# Patient Record
Sex: Female | Born: 1970 | Race: White | Hispanic: No | Marital: Single | Smoking: Former smoker
Health system: Southern US, Community
[De-identification: ages and names within clinical notes are randomized; demographics above are authoritative.]

## PROBLEM LIST (undated history)

## (undated) DIAGNOSIS — E119 Type 2 diabetes mellitus without complications: Secondary | ICD-10-CM

## (undated) DIAGNOSIS — J45909 Unspecified asthma, uncomplicated: Secondary | ICD-10-CM

## (undated) DIAGNOSIS — E7800 Pure hypercholesterolemia, unspecified: Secondary | ICD-10-CM

## (undated) DIAGNOSIS — E079 Disorder of thyroid, unspecified: Secondary | ICD-10-CM

---

## 2008-06-14 HISTORY — PX: PILONIDAL CYST DRAINAGE: SHX743

## 2021-03-26 ENCOUNTER — Other Ambulatory Visit: Payer: Self-pay

## 2021-03-26 ENCOUNTER — Ambulatory Visit: Payer: Self-pay

## 2021-03-26 ENCOUNTER — Ambulatory Visit
Admission: EM | Admit: 2021-03-26 | Discharge: 2021-03-26 | Disposition: A | Discharge status: Home / Self Care | Payer: 59 | Attending: Family Medicine | Admitting: Family Medicine

## 2021-03-26 DIAGNOSIS — J0100 Acute maxillary sinusitis, unspecified: Secondary | ICD-10-CM

## 2021-03-26 DIAGNOSIS — R102 Pelvic and perineal pain unspecified side: Secondary | ICD-10-CM

## 2021-03-26 DIAGNOSIS — E282 Polycystic ovarian syndrome: Secondary | ICD-10-CM | POA: Diagnosis present

## 2021-03-26 DIAGNOSIS — E1165 Type 2 diabetes mellitus with hyperglycemia: Secondary | ICD-10-CM | POA: Diagnosis present

## 2021-03-26 HISTORY — DX: Disorder of thyroid, unspecified: E07.9

## 2021-03-26 HISTORY — DX: Type 2 diabetes mellitus without complications: E11.9

## 2021-03-26 HISTORY — DX: Unspecified asthma, uncomplicated: J45.909

## 2021-03-26 HISTORY — DX: Pure hypercholesterolemia, unspecified: E78.00

## 2021-03-26 LAB — POCT URINALYSIS DIP (MANUAL ENTRY)
Bilirubin, UA: NEGATIVE
Glucose, UA: >=1000 mg/dL — AB
Leukocytes, UA: NEGATIVE
Nitrite, UA: NEGATIVE
Protein Ur, POC: NEGATIVE mg/dL
Spec Grav, UA: 1.01 (ref 1.010–1.025)
Urobilinogen, UA: 0.2 E.U./dL
pH, UA: 5 (ref 5.0–8.0)

## 2021-03-26 LAB — POCT FASTING CBG KUC MANUAL ENTRY: POCT Glucose (KUC): 383 mg/dL — AB (ref 70–99)

## 2021-03-26 MED ORDER — EMPAGLIFLOZIN 25 MG PO TABS: 25.0000 mg | ORAL_TABLET | Freq: Every day | ORAL | 2 refills | Status: AC

## 2021-03-26 MED ORDER — AMOXICILLIN-POT CLAVULANATE 875-125 MG PO TABS: 1.0000 | ORAL_TABLET | Freq: Two times a day (BID) | ORAL | 0 refills | Status: DC

## 2021-03-26 MED ORDER — NORETHIN-ETH ESTRAD TRIPHASIC 0.5/0.75/1-35 MG-MCG PO TABS: 1.0000 | ORAL_TABLET | Freq: Every day | ORAL | 2 refills | Status: DC

## 2021-03-26 NOTE — ED Triage Notes (Signed)
Pt presents with complaints of cough, chest congestion, and scratchy throat.  Pt endorses in April she had an elevated WBC she was supposed to get rechecked. In May she was diagnosed with a pelvic infection that got treated with doxycycline. She then got Covid in June. She never had her WBC rechecked. Reports still having pelvic pressure.

## 2021-03-27 LAB — CERVICOVAGINAL ANCILLARY ONLY
Bacterial Vaginitis (gardnerella): NEGATIVE
Candida Glabrata: POSITIVE — AB
Candida Vaginitis: NEGATIVE
Chlamydia: NEGATIVE
Comment: NEGATIVE
Comment: NEGATIVE
Comment: NEGATIVE
Comment: NEGATIVE
Comment: NEGATIVE
Comment: NORMAL
Neisseria Gonorrhea: NEGATIVE
Trichomonas: NEGATIVE

## 2021-03-30 ENCOUNTER — Telehealth (HOSPITAL_COMMUNITY): Payer: Self-pay | Admitting: Emergency Medicine

## 2021-03-30 MED ORDER — TERCONAZOLE 0.4 % VA CREA
1.0000 | TOPICAL_CREAM | Freq: Every day | VAGINAL | 0 refills | Status: AC
Start: 2021-03-30 — End: 2021-04-04

## 2021-03-30 NOTE — ED Provider Notes (Signed)
[facility]    CSN: [id number] Arrival date & time: 03/26/21  1609      History   Chief Complaint Chief Complaint  Patient presents with   Nasal Congestion   Cough    HPI Christy Torres is a 50 y.o. female.   Presenting today with multiple concerns. She states she is having several week history of cough productive of sputum, scratchy throat, worsening sinus pain and pressure, and fatigue. Has had these issues ongoing since getting COVID earlier this year which she is suffering from long covid sxs including chronic cough, wheezing, severe fatigue where she is often unable to leave her bed. She has been trying OTC cold medication with minimal relief. Known hx of asthma, allergic rhinitis on intermittent antihistamine, singulair regimen. No known new sick contacts. She is also complaining of ongoing pelvic pressure and pain for the past 5 months or so. She states she was treated for a pelvic infection with doxycycline [place] her but sxs never improved. Some discharge but no dysuria, hematuria, urinary frequency, new sexual partners, concern for STI or pregnancy. LMP 03/04/21. 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 Has yet to be able to establish with a PCP since moving her several months ago. Denies fever, chills, CP, SOB, N/V/D.    Past Medical History:  Diagnosis Date   Asthma    Diabetes mellitus without complication (HCC)    Hypercholesteremia    Thyroid disease     There are no problems to display for this patient.   History reviewed. No pertinent surgical history.  OB History   No obstetric history on file.      Home Medications    Prior to Admission medications   Medication Sig Start Date End Date Taking? Authorizing Provider  amoxicillin-clavulanate (AUGMENTIN) 875-125 MG tablet Take 1 tablet by mouth every 12 (twelve) hours. 03/26/21  Yes  Particia Nearing, PA-C  azelastine (OPTIVAR) 0.05 % ophthalmic solution 1 drop 2 (two) times daily.   Yes [provider]  escitalopram (LEXAPRO) 10 MG tablet Take 10 mg by mouth daily.   Yes [provider]  glimepiride (AMARYL) 4 MG tablet Take 4 mg by mouth daily with breakfast.   Yes [provider]  levothyroxine (SYNTHROID) 50 MCG tablet Take 50 mcg by mouth daily before breakfast.   Yes [provider]  metoprolol succinate (TOPROL-XL) 25 MG 24 hr tablet Take 25 mg by mouth daily.   Yes [provider]  montelukast (SINGULAIR) 10 MG tablet Take 10 mg by mouth at bedtime.   Yes [provider]  simvastatin (ZOCOR) 10 MG tablet Take 10 mg by mouth daily.   Yes [provider]  sitaGLIPtin-metformin (JANUMET) 50-1000 MG tablet Take 1 tablet by mouth 2 (two) times daily with a meal.   Yes [provider]  empagliflozin (JARDIANCE) 25 MG TABS tablet Take 1 tablet (25 mg total) by mouth daily. 03/26/21   Particia Nearing, PA-C  norethindrone-ethinyl estradiol (CYCLAFEM) 0.5/0.75/1-35 MG-MCG tablet Take 1 tablet by mouth daily. 03/26/21   Particia Nearing, PA-C  terconazole (TERAZOL 7) 0.4 % vaginal cream Place 1 applicator vaginally at bedtime for 5 days. 03/30/21 04/04/21  Merrilee Jansky, MD    Family History Family History  Problem Relation Age of Onset   Healthy Mother    Healthy Father  Social History Social History   Tobacco Use   Smoking status: Never   Smokeless tobacco: Never     Allergies   Patient has no known allergies.   Review of Systems Review of Systems PER HPI   Physical Exam Triage Vital Signs ED Triage Vitals  Enc Vitals Group     BP 03/26/21 1713 (!) 144/97     Pulse Rate 03/26/21 1713 (!) 107     Resp 03/26/21 1713 19     Temp 03/26/21 1713 98.9 F (37.2 C)     Temp src --      SpO2 03/26/21 1713 97 %     Weight --      Height --      Head  Circumference --      Peak Flow --      Pain Score 03/26/21 1708 2     Pain Loc --      Pain Edu? --      Excl. in GC? --    No data found.  Updated Vital Signs BP (!) 144/97   Pulse (!) 107   Temp 98.9 F (37.2 C)   Resp 19   LMP 03/04/2021   SpO2 97%   Visual Acuity Right Eye Distance:   Left Eye Distance:   Bilateral Distance:    Right Eye Near:   Left Eye Near:    Bilateral Near:     Physical Exam Vitals and nursing note reviewed.  Constitutional:      Appearance: Normal appearance. She is not ill-appearing.  HENT:     Head: Atraumatic.     Right Ear: Tympanic membrane normal.     Left Ear: Tympanic membrane normal.     Nose: Congestion present.     Comments: B/l maxillary sinuses ttp    Mouth/Throat:     Mouth: Mucous membranes are moist.  Eyes:     Extraocular Movements: Extraocular movements intact.     Conjunctiva/sclera: Conjunctivae normal.  Cardiovascular:     Rate and Rhythm: Normal rate and regular rhythm.     Heart sounds: Normal heart sounds.  Pulmonary:     Effort: Pulmonary effort is normal. No respiratory distress.     Breath sounds: Normal breath sounds. No wheezing or rales.  Abdominal:     General: Bowel sounds are normal. There is no distension.     Palpations: Abdomen is soft.     Tenderness: There is no abdominal tenderness. There is no guarding.  Musculoskeletal:        General: Normal range of motion.     Cervical back: Normal range of motion and neck supple.     Comments: Denies GU exam, self swab performed   Skin:    General: Skin is warm and dry.  Neurological:     Mental Status: She is alert and oriented to person, place, and time.     Motor: No weakness.     Gait: Gait normal.  Psychiatric:        Mood and Affect: Mood normal.        Thought Content: Thought content normal.        Judgment: Judgment normal.     UC Treatments / Results  Labs (all labs ordered are listed, but only abnormal results are displayed) Labs  Reviewed  POCT URINALYSIS DIP (MANUAL ENTRY) - Abnormal; Notable for the following components:      Result Value   Glucose, UA >=1,000 (*)    Ketones, POC  UA trace (5) (*)    Blood, UA trace-intact (*)    All other components within normal limits  POCT FASTING CBG [facility] MANUAL ENTRY - Abnormal; Notable for the following components:   POCT Glucose ([facility]) 383 (*)    All other components within normal limits  CERVICOVAGINAL ANCILLARY ONLY - Abnormal; Notable for the following components:   Candida Glabrata Positive (*)    All other components within normal limits    EKG   Radiology No results found.  Procedures Procedures (including critical care time)  Medications Ordered in UC Medications - No data to display  Initial Impression / Assessment and Plan / UC Course  I have reviewed the triage vital signs and the nursing notes.  Pertinent labs & imaging results that were available during my care of the patient were reviewed by me and considered in my medical decision making (see chart for details).     Will cover for sinusitis with augmentin though suspect her current sxs more inflammatory, allergic in nature. Unable to treat with steroids as her glucose is signifciantly poorly controlled in the high 300s currently and she also has intolerance to steroid nasal sprays. COntinue allergy regimen as tolerated. WIll refill jardiance and reviewed lifestyle measures for improved control. She establishes next month with PCP for recheck. Also requests OCP refill for her PCOS. Vaginal swab sent given ongoing pelvic pressure. U/A neg for UTI.   Final Clinical Impressions(s) / UC Diagnoses   Final diagnoses:  Acute maxillary sinusitis, recurrence not specified  Type 2 diabetes mellitus with hyperglycemia, without long-term current use of insulin (HCC)  PCOS (polycystic ovarian syndrome)  Pelvic pressure in female   Discharge Instructions   None    ED Prescriptions     Medication Sig  Dispense Auth. Provider   empagliflozin (JARDIANCE) 25 MG TABS tablet Take 1 tablet (25 mg total) by mouth daily. 30 tablet Particia Nearing, [place]   norethindrone-ethinyl estradiol (CYCLAFEM) 0.5/0.75/1-35 MG-MCG tablet Take 1 tablet by mouth daily. 28 tablet Particia Nearing, [place]   amoxicillin-clavulanate (AUGMENTIN) 875-125 MG tablet Take 1 tablet by mouth every 12 (twelve) hours. 14 tablet Particia Nearing, [place]      PDMP not reviewed this encounter.   Particia Nearing, [place] 03/30/21 2201

## 2021-04-09 ENCOUNTER — Other Ambulatory Visit: Payer: Self-pay

## 2021-04-09 ENCOUNTER — Ambulatory Visit
Admission: EM | Admit: 2021-04-09 | Discharge: 2021-04-09 | Disposition: A | Discharge status: Home / Self Care | Payer: 59

## 2021-04-09 ENCOUNTER — Ambulatory Visit (INDEPENDENT_AMBULATORY_CARE_PROVIDER_SITE_OTHER): Payer: 59

## 2021-04-09 ENCOUNTER — Encounter: Payer: Self-pay | Admitting: Emergency Medicine

## 2021-04-09 DIAGNOSIS — R0602 Shortness of breath: Secondary | ICD-10-CM | POA: Diagnosis not present

## 2021-04-09 DIAGNOSIS — R059 Cough, unspecified: Secondary | ICD-10-CM

## 2021-04-09 DIAGNOSIS — R051 Acute cough: Secondary | ICD-10-CM | POA: Diagnosis not present

## 2021-04-09 MED ORDER — PREDNISONE 50 MG PO TABS: ORAL_TABLET | ORAL | 0 refills | Status: DC

## 2021-04-09 NOTE — ED Triage Notes (Signed)
Patient c/o productive cough and sinus pressure x 2 weeks.   Patient endorses increasing fatigue. Patient endorses difficulty with inhalation at times.   Patient endorses SOB upon exertion at times.   Patient endorses chest congestion.   Patient was seen at this clinic on 10/13 with similar symptoms that haven't resolved.   Patient endorses "since I had COVID in June, I've had fatigue and I haven't exactly been the same".   History of Asthma.

## 2021-04-09 NOTE — ED Provider Notes (Signed)
[facility]    CSN: [id number] Arrival date & time: 04/09/21  1607      History   Chief Complaint Chief Complaint  Patient presents with   Cough    HPI Christy Torres is a 50 y.o. female.   The history is provided by the patient. No language interpreter was used.  Cough Cough characteristics:  Non-productive Sputum characteristics:  Nondescript Severity:  Moderate Onset quality:  Sudden Timing:  Constant Progression:  Worsening Chronicity:  New Smoker: no   Relieved by:  Nothing Worsened by:  Nothing Ineffective treatments:  None tried Associated symptoms: rhinorrhea, shortness of breath and sinus congestion   Risk factors: recent infection   Pt reports she has had cough for several weeks.  Pt has had asthma. Pt treated 2 weeks ago with antibiotic.  Pt has continued to cough  Past Medical History:  Diagnosis Date   Asthma    Diabetes mellitus without complication (HCC)    Hypercholesteremia    Thyroid disease     There are no problems to display for this patient.   History reviewed. No pertinent surgical history.  OB History   No obstetric history on file.      Home Medications    Prior to Admission medications   Medication Sig Start Date End Date Taking? Authorizing Provider  cetirizine (ZYRTEC) 10 MG tablet Take 10 mg by mouth daily.   Yes [provider]  empagliflozin (JARDIANCE) 25 MG TABS tablet Take 1 tablet (25 mg total) by mouth daily. 03/26/21  Yes Particia Nearing, PA-C  escitalopram (LEXAPRO) 10 MG tablet Take 10 mg by mouth daily.   Yes [provider]  glimepiride (AMARYL) 4 MG tablet Take 4 mg by mouth daily with breakfast.   Yes [provider]  levothyroxine (SYNTHROID) 50 MCG tablet Take 50 mcg by mouth daily before breakfast.   Yes [provider]  metoprolol succinate (TOPROL-XL) 25 MG 24 hr tablet Take 25 mg by mouth daily.   Yes [provider]  montelukast  (SINGULAIR) 10 MG tablet Take 10 mg by mouth at bedtime.   Yes [provider]  norethindrone-ethinyl estradiol (CYCLAFEM) 0.5/0.75/1-35 MG-MCG tablet Take 1 tablet by mouth daily. 03/26/21  Yes Particia Nearing, PA-C  simvastatin (ZOCOR) 10 MG tablet Take 10 mg by mouth daily.   Yes [provider]  sitaGLIPtin-metformin (JANUMET) 50-1000 MG tablet Take 1 tablet by mouth 2 (two) times daily with a meal.   Yes [provider]  amoxicillin-clavulanate (AUGMENTIN) 875-125 MG tablet Take 1 tablet by mouth every 12 (twelve) hours. 03/26/21   Particia Nearing, PA-C  azelastine (OPTIVAR) 0.05 % ophthalmic solution 1 drop 2 (two) times daily.    [provider]    Family History Family History  Problem Relation Age of Onset   Healthy Mother    Healthy Father     Social History Social History   Tobacco Use   Smoking status: Never   Smokeless tobacco: Never     Allergies   Patient has no known allergies.   Review of Systems Review of Systems  HENT:  Positive for rhinorrhea.   Respiratory:  Positive for cough and shortness of breath.   All other systems reviewed and are negative.   Physical Exam Triage Vital Signs ED Triage Vitals [04/09/21 1059]  Enc Vitals Group     BP (!) 164/99     Pulse Rate 91     Resp 18  Temp 98.2 F (36.8 C)     Temp Source Oral     SpO2 96 %     Weight      Height      Head Circumference      Peak Flow      Pain Score 7     Pain Loc      Pain Edu?      Excl. in GC?    No data found.  Updated Vital Signs BP (!) 164/99 (BP Location: Right Arm)   Pulse 91   Temp 98.2 F (36.8 C) (Oral)   Resp 18   LMP 04/01/2021 (Exact Date)   SpO2 96%   Visual Acuity Right Eye Distance:   Left Eye Distance:   Bilateral Distance:    Right Eye Near:   Left Eye Near:    Bilateral Near:     Physical Exam Vitals and nursing note reviewed.  Constitutional:      Appearance: She is well-developed.   HENT:     Head: Normocephalic.     Mouth/Throat:     Mouth: Mucous membranes are moist.  Cardiovascular:     Rate and Rhythm: Normal rate.  Pulmonary:     Effort: Pulmonary effort is normal.  Abdominal:     General: Abdomen is flat. There is no distension.  Musculoskeletal:        General: Normal range of motion.     Cervical back: Normal range of motion.  Skin:    General: Skin is warm.  Neurological:     General: No focal deficit present.     Mental Status: She is alert and oriented to person, place, and time.     UC Treatments / Results  Labs (all labs ordered are listed, but only abnormal results are displayed) Labs Reviewed - No data to display  EKG   Radiology DG Chest 2 View  Result Date: 04/09/2021 CLINICAL DATA:  50 year old female with history of cough. Difficult inhalation. Shortness of breath on exertion. EXAM: CHEST - 2 VIEW COMPARISON:  No priors. FINDINGS: Lung volumes are normal. No consolidative airspace disease. No pleural effusions. No pneumothorax. No pulmonary nodule or mass noted. Pulmonary vasculature and the cardiomediastinal silhouette are within normal limits. IMPRESSION: No radiographic evidence of acute cardiopulmonary disease. Electronically Signed   By: Trudie Reed M.D.   On: 04/09/2021 11:11    Procedures Procedures (including critical care time)  Medications Ordered in UC Medications - No data to display  Initial Impression / Assessment and Plan / UC Course  I have reviewed the triage vital signs and the nursing notes.  Pertinent labs & imaging results that were available during my care of the patient were reviewed by me and considered in my medical decision making (see chart for details).     MDM:  chest xray  no acute   Pt given rx for prednisone.  Pt advised to return if any problems.  Final Clinical Impressions(s) / UC Diagnoses   Final diagnoses:  Acute cough   Discharge Instructions   None    ED Prescriptions    None    PDMP not reviewed this encounter. An After Visit Summary was printed and given to the patient.    Elson Areas, [place] 04/09/21 1128

## 2021-04-09 NOTE — Discharge Instructions (Signed)
Return if any problems.

## 2021-12-21 ENCOUNTER — Other Ambulatory Visit (HOSPITAL_COMMUNITY)
Admission: RE | Admit: 2021-12-21 | Discharge: 2021-12-21 | Disposition: A | Discharge status: Home / Self Care | Payer: 59 | Source: Ambulatory Visit | Attending: Registered Nurse | Admitting: Registered Nurse

## 2021-12-21 ENCOUNTER — Encounter: Payer: Self-pay | Admitting: Registered Nurse

## 2021-12-21 ENCOUNTER — Ambulatory Visit (INDEPENDENT_AMBULATORY_CARE_PROVIDER_SITE_OTHER): Payer: 59 | Admitting: Registered Nurse

## 2021-12-21 VITALS — BP 118/82 | HR 86 | Temp 98.5°F | Resp 16 | Ht 68.0 in | Wt 256.0 lb

## 2021-12-21 DIAGNOSIS — E1165 Type 2 diabetes mellitus with hyperglycemia: Secondary | ICD-10-CM | POA: Diagnosis not present

## 2021-12-21 DIAGNOSIS — Z8349 Family history of other endocrine, nutritional and metabolic diseases: Secondary | ICD-10-CM

## 2021-12-21 DIAGNOSIS — Z1211 Encounter for screening for malignant neoplasm of colon: Secondary | ICD-10-CM | POA: Insufficient documentation

## 2021-12-21 DIAGNOSIS — R5382 Chronic fatigue, unspecified: Secondary | ICD-10-CM | POA: Insufficient documentation

## 2021-12-21 DIAGNOSIS — E079 Disorder of thyroid, unspecified: Secondary | ICD-10-CM | POA: Insufficient documentation

## 2021-12-21 NOTE — Assessment & Plan Note (Signed)
Unclear etiology. Perhaps chronic fatigue, may consider switch to duloxetine from lexapro. Consider counseling.  Labs collected. Will follow up with the patient as warranted.

## 2021-12-21 NOTE — Progress Notes (Signed)
New Patient Office Visit  Subjective:  Patient ID: Christy Torres, female    DOB: [birth year]  Age: 51 y.o. MRN: [id number]  CC:  Chief Complaint  Patient presents with   Establish Care    Est care  Pt states she had COVID last June 2022 Still having some fatigue and aching     HPI Christy Torres presents to establish care Histories reviewed and updated with patient.   Fatigue Started June 2022 with a course of COVID. This was her first course.  Symptoms were mostly fatigue, which has lasted. Limited other symptoms.  Thyroid disease: 36mg dosing for quite some time. Last TSH on 05/11/21 wnl at 3.300, previously, September 29, 2020 was wnl at 1.95. Had been on higher dose previously, but had AE of rapid heartbeat. Sleep - has gone to bed late her whole life. Since becoming more fatigued, some nights falls asleep at 8 or 9.  Sometimes sleeps more during the day related to fatigue.  No snoring, no sudden waking, no headaches on waking, has one uncle with maternal uncle with OSA but otherwise none.  Outpatient Encounter Medications as of 12/21/2021  Medication Sig   azelastine (OPTIVAR) 0.05 % ophthalmic solution 1 drop 2 (two) times daily.   cetirizine (ZYRTEC) 10 MG tablet Take 10 mg by mouth daily.   empagliflozin (JARDIANCE) 25 MG TABS tablet Take 1 tablet (25 mg total) by mouth daily.   escitalopram (LEXAPRO) 10 MG tablet Take 10 mg by mouth daily.   glimepiride (AMARYL) 4 MG tablet Take 4 mg by mouth daily with breakfast.   levothyroxine (SYNTHROID) 50 MCG tablet Take 50 mcg by mouth daily before breakfast.   metoprolol succinate (TOPROL-XL) 25 MG 24 hr tablet Take 25 mg by mouth daily.   norethindrone-ethinyl estradiol (CYCLAFEM) 0.5/0.75/1-35 MG-MCG tablet Take 1 tablet by mouth daily.   simvastatin (ZOCOR) 10 MG tablet Take 10 mg by mouth daily.   sitaGLIPtin-metformin (JANUMET) 50-1000 MG tablet Take 1 tablet by mouth 2 (two) times daily with a meal.   [DISCONTINUED]  levothyroxine (SYNTHROID) 50 MCG tablet Take 50 mcg by mouth daily.   [DISCONTINUED] simvastatin (ZOCOR) 10 MG tablet Take 10 mg by mouth daily.   [DISCONTINUED] amoxicillin-clavulanate (AUGMENTIN) 875-125 MG tablet Take 1 tablet by mouth every 12 (twelve) hours. (Patient not taking: Reported on 12/21/2021)   [DISCONTINUED] montelukast (SINGULAIR) 10 MG tablet Take 10 mg by mouth at bedtime. (Patient not taking: Reported on 12/21/2021)   [DISCONTINUED] predniSONE (DELTASONE) 50 MG tablet One tablet a day (Patient not taking: Reported on 12/21/2021)   No facility-administered encounter medications on file as of 12/21/2021.    Past Medical History:  Diagnosis Date   Asthma    Diabetes mellitus without complication ([facility] [place]    Hypercholesteremia    Thyroid disease     Past Surgical History:  Procedure Laterality Date   PILONIDAL CYST DRAINAGE  2010    Family History  Problem Relation Age of Onset   Thyroid disease Mother    Hypertension Mother    Healthy Father    Hypertension Brother    Diabetes Brother     Social History   Socioeconomic History   Marital status: Single    Spouse name: Not on file   Number of children: 0   Years of education: Not on file   Highest education level: Not on file  Occupational History   Occupation: Unemployed  Tobacco Use   Smoking status: Former    Packs/day: 0.50  Years: 0.50    Total pack years: 0.25    Types: Cigarettes   Smokeless tobacco: Never  Vaping Use   Vaping Use: Never used  Substance and Sexual Activity   Alcohol use: Yes    Alcohol/week: 1.0 standard drink of alcohol    Types: 1 Glasses of wine per week   Drug use: Never   Sexual activity: Not Currently  Other Topics Concern   Not on file  Social History Narrative   Not on file   Social Determinants of Health   Financial Resource Strain: Not on file  Food Insecurity: Not on file  Transportation Needs: Not on file  Physical Activity: Not on file  Stress: Not on  file  Social Connections: Not on file  Intimate Partner Violence: Not on file    ROS Review of Systems  Constitutional:  Positive for fatigue. Negative for activity change, appetite change, chills, diaphoresis, fever and unexpected weight change.  HENT: Negative.    Eyes: Negative.   Respiratory: Negative.    Cardiovascular: Negative.   Gastrointestinal: Negative.   Endocrine: Negative.   Genitourinary: Negative.   Musculoskeletal: Negative.   Skin: Negative.   Allergic/Immunologic: Negative.   Neurological: Negative.   Hematological: Negative.   Psychiatric/Behavioral: Negative.    All other systems reviewed and are negative.   Objective:   Today's Vitals: BP 118/82   Pulse 86   Temp 98.5 F (36.9 C) (Temporal)   Resp 16   Ht 5' 8" (1.727 m)   Wt 256 lb (116.1 kg)   SpO2 98%   BMI 38.92 kg/m   Physical Exam Vitals and nursing note reviewed.  Constitutional:      General: She is not in acute distress.    Appearance: Normal appearance. She is normal weight. She is not ill-appearing, toxic-appearing or diaphoretic.  Cardiovascular:     Rate and Rhythm: Normal rate and regular rhythm.     Heart sounds: Normal heart sounds. No murmur heard.    No friction rub. No gallop.  Pulmonary:     Effort: Pulmonary effort is normal. No respiratory distress.     Breath sounds: Normal breath sounds. No stridor. No wheezing, rhonchi or rales.  Chest:     Chest wall: No tenderness.  Skin:    General: Skin is warm and dry.  Neurological:     General: No focal deficit present.     Mental Status: She is alert and oriented to person, place, and time. Mental status is at baseline.  Psychiatric:        Mood and Affect: Mood normal.        Behavior: Behavior normal.        Thought Content: Thought content normal.        Judgment: Judgment normal.         Assessment & Plan:   Problem List Items Addressed This Visit       Endocrine   Type 2 diabetes mellitus with  hyperglycemia, without long-term current use of insulin ([place]) - Primary    Labs collected. Will follow up with the patient as warranted. Continue current meds, adjust as indicated.      Relevant Orders   Hemoglobin A1c   Thyroid disease    Labs collected. Will follow up with the patient as warranted. Adjust meds as indicated.      Relevant Orders   Antinuclear Antib (ANA)   T4, free     Other   Chronic fatigue  Unclear etiology. Perhaps chronic fatigue, may consider switch to duloxetine from lexapro. Consider counseling.  Labs collected. Will follow up with the patient as warranted.       Relevant Orders   CBC with Differential/Platelet   Comprehensive metabolic panel   Hemoglobin A1c   Lipid panel   TSH   Microalbumin / creatinine urine ratio   Vitamin D (25 hydroxy)   B12 and Folate Panel   Urinalysis, Routine w reflex microscopic   Urine cytology ancillary only([place])   Colon cancer screening    Opts for cologuard, will send kit.      Relevant Orders   Cologuard   Family history of thyroid disease in mother   Relevant Orders   Antinuclear Antib (ANA)    Follow-up: Return if symptoms worsen or fail to improve.   Maximiano Coss, NP

## 2021-12-21 NOTE — Assessment & Plan Note (Signed)
Labs collected. Will follow up with the patient as warranted. Adjust meds as indicated.

## 2021-12-21 NOTE — Patient Instructions (Addendum)
Ms. Digiulio -   Randie Heinz to meet you!  Let's cast a wide net. I'll be in touch with results tomorrow.   We'll plan from there.  Thanks,  Luan Pulling

## 2021-12-21 NOTE — Assessment & Plan Note (Signed)
Labs collected. Will follow up with the patient as warranted. Continue current meds, adjust as indicated.

## 2021-12-21 NOTE — Assessment & Plan Note (Signed)
Opts for cologuard, will send kit.

## 2021-12-22 LAB — URINALYSIS, ROUTINE W REFLEX MICROSCOPIC
Bilirubin Urine: NEGATIVE
Hgb urine dipstick: NEGATIVE
Ketones, ur: NEGATIVE
Leukocytes,Ua: NEGATIVE
Nitrite: NEGATIVE
RBC / HPF: NONE SEEN (ref 0–?)
Specific Gravity, Urine: 1.01 (ref 1.000–1.030)
Total Protein, Urine: NEGATIVE
Urine Glucose: >=1000 — AB
Urobilinogen, UA: 0.2 (ref 0.0–1.0)
pH: 5.5 (ref 5.0–8.0)

## 2021-12-22 LAB — COMPREHENSIVE METABOLIC PANEL
ALT: 20 U/L (ref 0–35)
AST: 20 U/L (ref 0–37)
Albumin: 4.4 g/dL (ref 3.5–5.2)
Alkaline Phosphatase: 53 U/L (ref 39–117)
BUN: 14 mg/dL (ref 6–23)
CO2: 23 mEq/L (ref 19–32)
Calcium: 9.2 mg/dL (ref 8.4–10.5)
Chloride: 102 mEq/L (ref 96–112)
Creatinine, Ser: 0.71 mg/dL (ref 0.40–1.20)
GFR: 100.33 mL/min (ref >60.00)
Glucose, Bld: 233 mg/dL — ABNORMAL HIGH (ref 70–99)
Potassium: 4.1 mEq/L (ref 3.5–5.1)
Sodium: 137 mEq/L (ref 135–145)
Total Bilirubin: 0.6 mg/dL (ref 0.2–1.2)
Total Protein: 7.2 g/dL (ref 6.0–8.3)

## 2021-12-22 LAB — B12 AND FOLATE PANEL
Folate: 24.2 ng/mL (ref >5.9)
Vitamin B-12: 184 pg/mL — ABNORMAL LOW (ref 211–911)

## 2021-12-22 LAB — T4, FREE: Free T4: 0.84 ng/dL (ref 0.60–1.60)

## 2021-12-22 LAB — CBC WITH DIFFERENTIAL/PLATELET
Basophils Absolute: 0 10*3/uL (ref 0.0–0.1)
Basophils Relative: 0.1 % (ref 0.0–3.0)
Eosinophils Absolute: 0.2 10*3/uL (ref 0.0–0.7)
Eosinophils Relative: 2 % (ref 0.0–5.0)
HCT: 46.5 % — ABNORMAL HIGH (ref 36.0–46.0)
Hemoglobin: 15.6 g/dL — ABNORMAL HIGH (ref 12.0–15.0)
Lymphocytes Relative: 39.7 % (ref 12.0–46.0)
Lymphs Abs: 3.1 10*3/uL (ref 0.7–4.0)
MCHC: 33.5 g/dL (ref 30.0–36.0)
MCV: 96 fl (ref 78.0–100.0)
Monocytes Absolute: 0.6 10*3/uL (ref 0.1–1.0)
Monocytes Relative: 7.5 % (ref 3.0–12.0)
Neutro Abs: 4 10*3/uL (ref 1.4–7.7)
Neutrophils Relative %: 50.7 % (ref 43.0–77.0)
Platelets: 242 10*3/uL (ref 150.0–400.0)
RBC: 4.85 Mil/uL (ref 3.87–5.11)
RDW: 12.4 % (ref 11.5–15.5)
WBC: 7.8 10*3/uL (ref 4.0–10.5)

## 2021-12-22 LAB — LIPID PANEL
Cholesterol: 179 mg/dL (ref 0–200)
HDL: 44.1 mg/dL (ref >39.00)
NonHDL: 134.88
Total CHOL/HDL Ratio: 4
Triglycerides: 300 mg/dL — ABNORMAL HIGH (ref 0.0–149.0)
VLDL: 60 mg/dL — ABNORMAL HIGH (ref 0.0–40.0)

## 2021-12-22 LAB — MICROALBUMIN / CREATININE URINE RATIO
Creatinine,U: 37.3 mg/dL
Microalb Creat Ratio: 1.9 mg/g (ref 0.0–30.0)
Microalb, Ur: <0.7 mg/dL (ref 0.0–1.9)

## 2021-12-22 LAB — HEMOGLOBIN A1C: Hgb A1c MFr Bld: 8.9 % — ABNORMAL HIGH (ref 4.6–6.5)

## 2021-12-22 LAB — TSH: TSH: 2.64 u[IU]/mL (ref 0.35–5.50)

## 2021-12-22 LAB — VITAMIN D 25 HYDROXY (VIT D DEFICIENCY, FRACTURES): VITD: 58.05 ng/mL (ref 30.00–100.00)

## 2021-12-22 LAB — LDL CHOLESTEROL, DIRECT: Direct LDL: 108 mg/dL

## 2021-12-23 LAB — URINE CYTOLOGY ANCILLARY ONLY
Bacterial Vaginitis-Urine: NEGATIVE
Candida Urine: NEGATIVE — AB
Candida Urine: POSITIVE — AB
Chlamydia: NEGATIVE
Comment: NEGATIVE
Comment: NEGATIVE
Comment: NORMAL
Neisseria Gonorrhea: NEGATIVE
Trichomonas: NEGATIVE

## 2021-12-24 LAB — ANA: Anti Nuclear Antibody (ANA): NEGATIVE

## 2021-12-28 ENCOUNTER — Encounter: Payer: Self-pay | Admitting: Registered Nurse

## 2021-12-30 ENCOUNTER — Telehealth: Payer: Self-pay

## 2021-12-30 NOTE — Telephone Encounter (Signed)
-----   Message from Janeece Agee, NP sent at 12/29/2021  7:10 AM EDT ----- Can call patient -   Sugars are high, as expected. Vitamin B12 is a little low. Would recommend daily multivitamin if she is not already taking.  Should work on sugars as we discussed as these can contribute to how she is feeling, but otherwise, labs don't show any acute concerns.  Thanks,  Luan Pulling

## 2021-12-30 NOTE — Telephone Encounter (Signed)
Spoke w/ pt and advised of lab results  

## 2022-01-04 ENCOUNTER — Encounter: Payer: Self-pay | Admitting: Registered Nurse

## 2022-01-05 ENCOUNTER — Other Ambulatory Visit: Payer: Self-pay

## 2022-01-05 MED ORDER — METOPROLOL SUCCINATE ER 25 MG PO TB24: 25.0000 mg | ORAL_TABLET | Freq: Every day | ORAL | 1 refills | Status: AC

## 2022-01-05 MED ORDER — NORETHIN-ETH ESTRAD TRIPHASIC 0.5/0.75/1-35 MG-MCG PO TABS: 1.0000 | ORAL_TABLET | Freq: Every day | ORAL | 2 refills | Status: DC

## 2022-01-05 MED ORDER — LEVOTHYROXINE SODIUM 50 MCG PO TABS: 50.0000 ug | ORAL_TABLET | Freq: Every day | ORAL | 1 refills | Status: AC

## 2022-01-06 ENCOUNTER — Other Ambulatory Visit: Payer: Self-pay

## 2022-01-06 MED ORDER — SIMVASTATIN 10 MG PO TABS: 10.0000 mg | ORAL_TABLET | Freq: Every day | ORAL | 1 refills | Status: AC

## 2022-01-12 LAB — COLOGUARD: COLOGUARD: NEGATIVE

## 2022-02-03 ENCOUNTER — Other Ambulatory Visit (HOSPITAL_BASED_OUTPATIENT_CLINIC_OR_DEPARTMENT_OTHER): Payer: Self-pay | Admitting: Registered Nurse

## 2022-02-03 DIAGNOSIS — Z1231 Encounter for screening mammogram for malignant neoplasm of breast: Secondary | ICD-10-CM

## 2022-02-04 ENCOUNTER — Ambulatory Visit (INDEPENDENT_AMBULATORY_CARE_PROVIDER_SITE_OTHER): Payer: 59

## 2022-02-04 DIAGNOSIS — Z1231 Encounter for screening mammogram for malignant neoplasm of breast: Secondary | ICD-10-CM | POA: Diagnosis not present

## 2022-04-21 ENCOUNTER — Other Ambulatory Visit: Payer: Self-pay

## 2022-04-21 ENCOUNTER — Other Ambulatory Visit: Payer: Self-pay | Admitting: Family Medicine

## 2022-04-21 MED ORDER — NORETHIN-ETH ESTRAD TRIPHASIC 0.5/0.75/1-35 MG-MCG PO TABS: 1.0000 | ORAL_TABLET | Freq: Every day | ORAL | 2 refills | Status: AC

## 2022-04-21 NOTE — Telephone Encounter (Signed)
Sent to wrong refill routing to [facility]

## 2022-06-22 DIAGNOSIS — Z0000 Encounter for general adult medical examination without abnormal findings: Secondary | ICD-10-CM | POA: Diagnosis not present

## 2022-06-29 DIAGNOSIS — R030 Elevated blood-pressure reading, without diagnosis of hypertension: Secondary | ICD-10-CM | POA: Diagnosis not present

## 2022-06-29 DIAGNOSIS — E119 Type 2 diabetes mellitus without complications: Secondary | ICD-10-CM | POA: Diagnosis not present

## 2022-06-29 DIAGNOSIS — E781 Pure hyperglyceridemia: Secondary | ICD-10-CM | POA: Diagnosis not present

## 2022-07-01 DIAGNOSIS — Z23 Encounter for immunization: Secondary | ICD-10-CM | POA: Diagnosis not present

## 2022-07-13 DIAGNOSIS — M5386 Other specified dorsopathies, lumbar region: Secondary | ICD-10-CM | POA: Diagnosis not present

## 2022-07-13 DIAGNOSIS — M9904 Segmental and somatic dysfunction of sacral region: Secondary | ICD-10-CM | POA: Diagnosis not present

## 2022-07-13 DIAGNOSIS — M9905 Segmental and somatic dysfunction of pelvic region: Secondary | ICD-10-CM | POA: Diagnosis not present

## 2022-07-13 DIAGNOSIS — M9903 Segmental and somatic dysfunction of lumbar region: Secondary | ICD-10-CM | POA: Diagnosis not present

## 2022-10-02 IMAGING — DX DG CHEST 2V
2 series · 2 of 2 positions shown · non-contrast
Comparison: No priors.

CLINICAL DATA: 50-year-old female with history of cough. Difficult
inhalation. Shortness of breath on exertion.

EXAM:
CHEST - 2 VIEW

[chest pa]
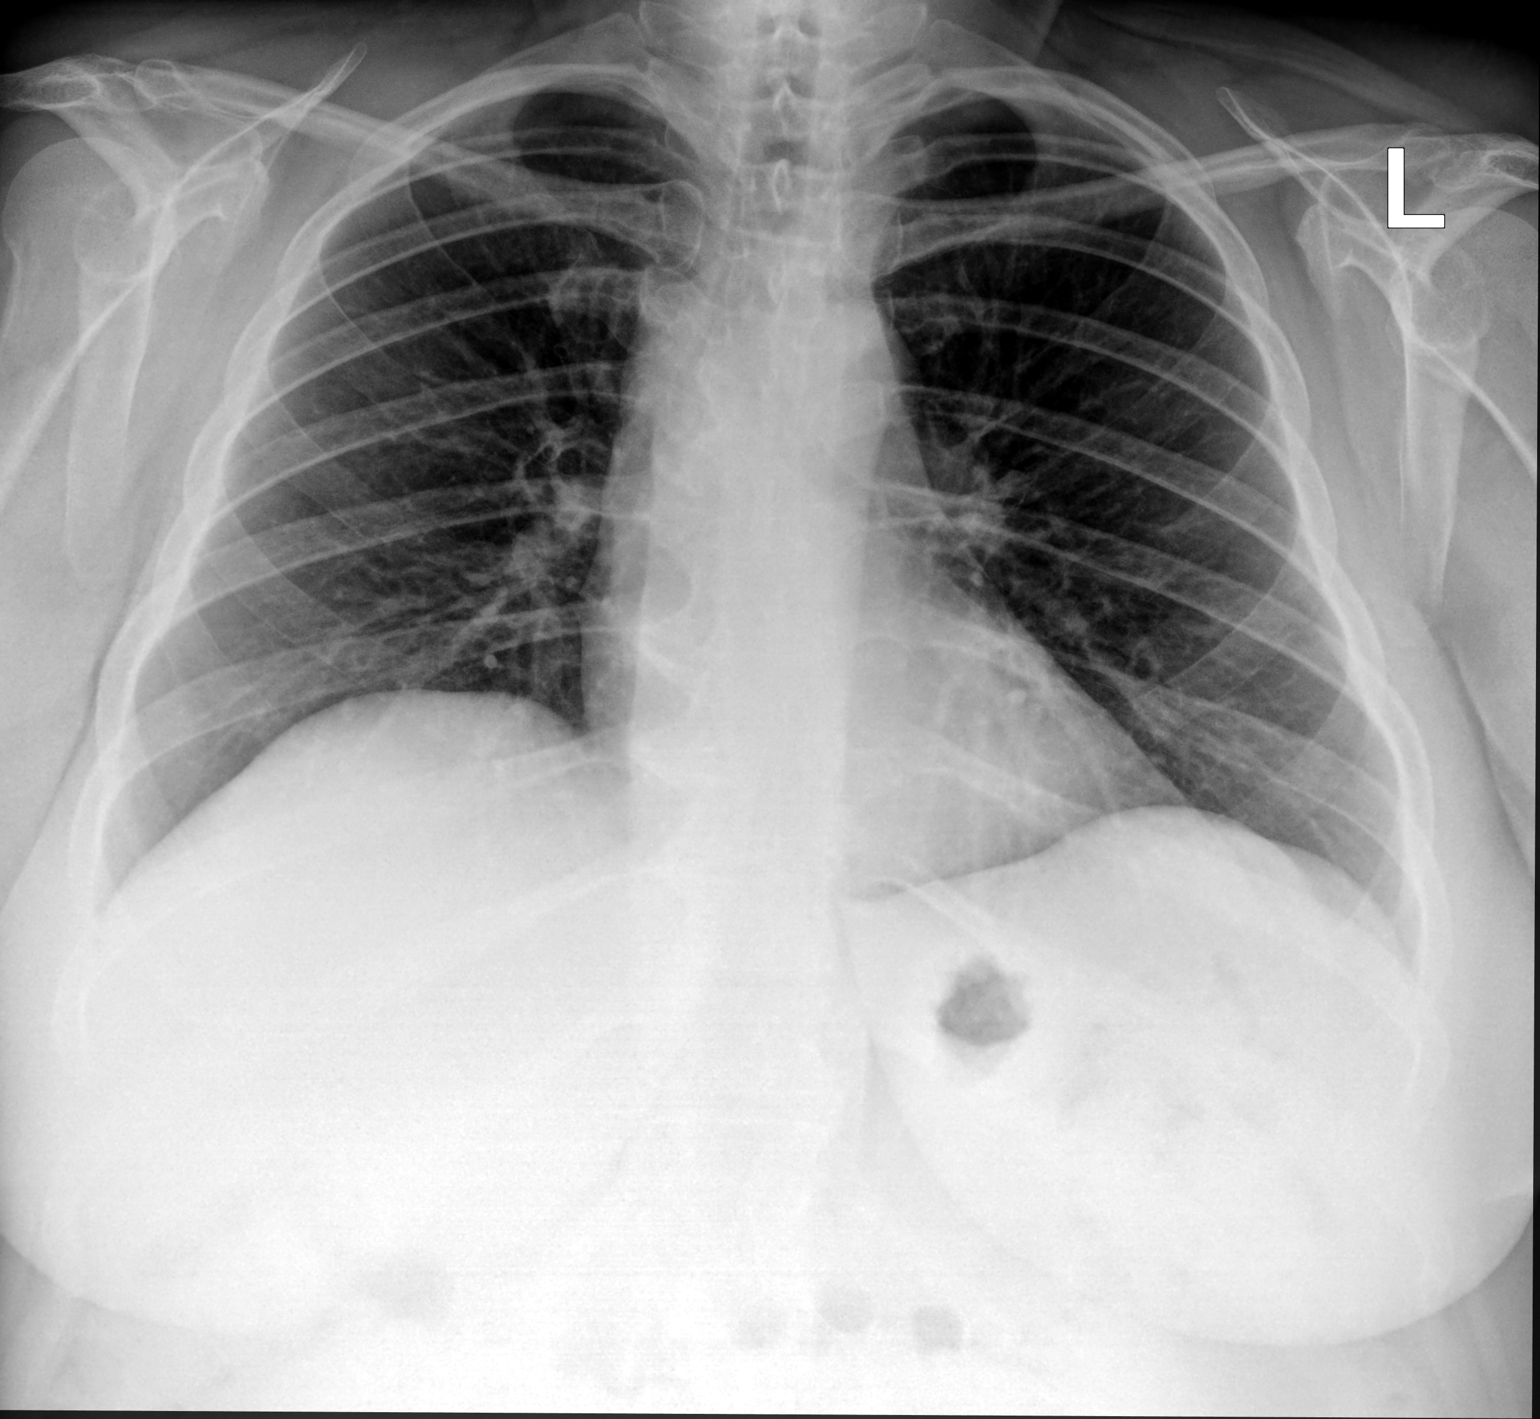

[chest lat]
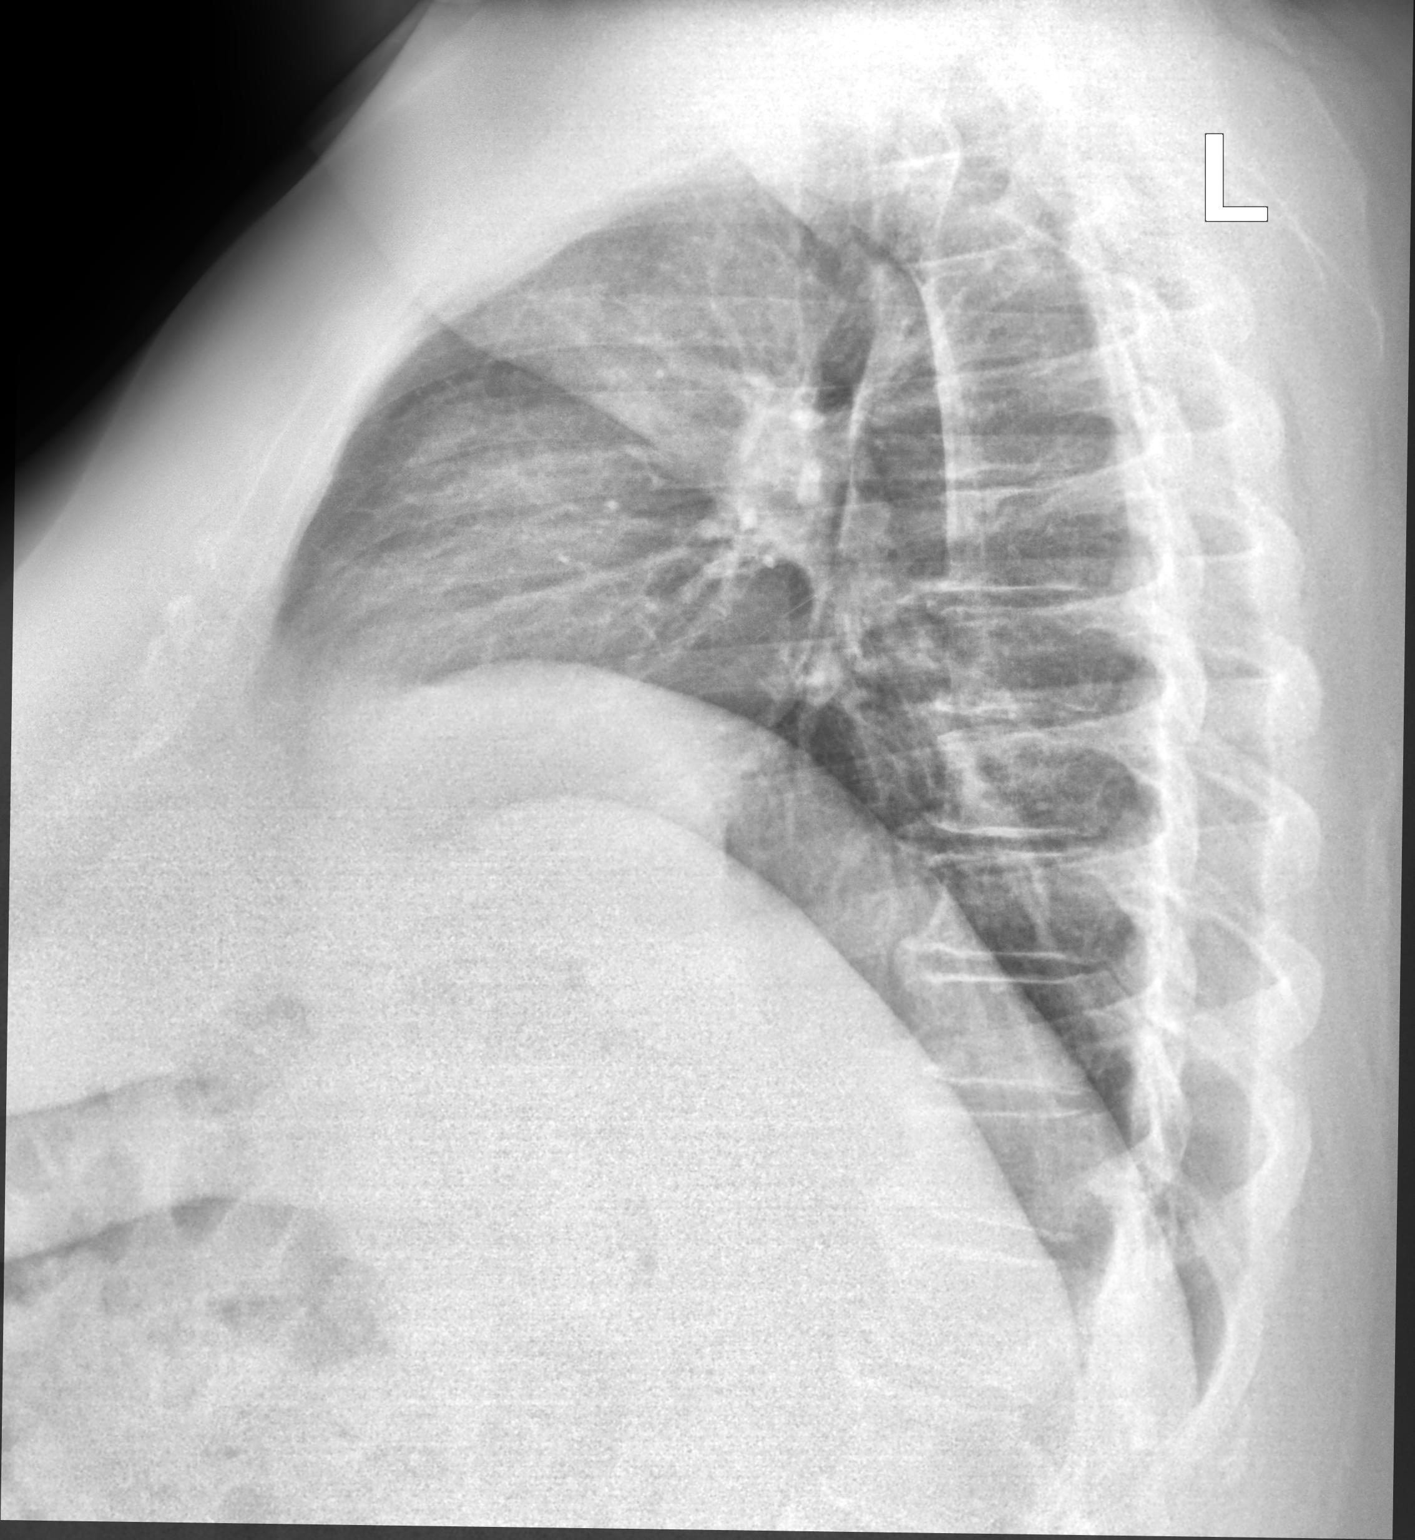

[2 of 2 positions shown; findings below may reference images not displayed]

FINDINGS: Lung volumes are normal. No consolidative airspace disease. No
pleural effusions. No pneumothorax. No pulmonary nodule or mass
noted. Pulmonary vasculature and the cardiomediastinal silhouette
are within normal limits.
IMPRESSION: No radiographic evidence of acute cardiopulmonary disease.

## 2023-03-23 ENCOUNTER — Other Ambulatory Visit: Payer: Self-pay | Admitting: Family Medicine

## 2023-03-23 DIAGNOSIS — Z1231 Encounter for screening mammogram for malignant neoplasm of breast: Secondary | ICD-10-CM

## 2023-04-27 ENCOUNTER — Ambulatory Visit: Payer: Medicaid Other

## 2023-04-27 DIAGNOSIS — Z1231 Encounter for screening mammogram for malignant neoplasm of breast: Secondary | ICD-10-CM | POA: Diagnosis not present

## 2023-05-11 ENCOUNTER — Other Ambulatory Visit: Payer: Self-pay | Admitting: Medical Genetics

## 2023-05-18 ENCOUNTER — Other Ambulatory Visit: Payer: Self-pay | Admitting: Medical Genetics

## 2023-05-18 ENCOUNTER — Other Ambulatory Visit (HOSPITAL_COMMUNITY)
Admission: RE | Admit: 2023-05-18 | Discharge: 2023-05-18 | Disposition: A | Discharge status: Home / Self Care | Payer: Self-pay | Source: Ambulatory Visit | Attending: Oncology | Admitting: Oncology

## 2023-05-30 LAB — GENECONNECT MOLECULAR SCREEN: Genetic Analysis Overall Interpretation: NEGATIVE

## 2023-11-02 ENCOUNTER — Other Ambulatory Visit: Payer: Self-pay

## 2023-11-02 ENCOUNTER — Ambulatory Visit: Attending: Family Medicine | Admitting: Physical Therapy

## 2023-11-02 DIAGNOSIS — M6281 Muscle weakness (generalized): Secondary | ICD-10-CM

## 2023-11-02 DIAGNOSIS — R262 Difficulty in walking, not elsewhere classified: Secondary | ICD-10-CM | POA: Diagnosis present

## 2023-11-02 NOTE — Therapy (Signed)
 OUTPATIENT PHYSICAL THERAPY EVALUATION   Patient Name: Christy Torres MRN: [id number] DOB:03-09-1971, 53 y.o., female Today's Date: 11/02/2023   PCP: Ulyess Gammons NP REFERRING PROVIDER: Elesa Grills FNP  END OF SESSION:  PT End of Session - 11/02/23 1106     Visit Number 1    Date for PT Re-Evaluation 01/25/24    Authorization Type [place] [employer] will submit    PT Start Time 1101    PT Stop Time 1144    PT Time Calculation (min) 43 min    Activity Tolerance Patient tolerated treatment well             Past Medical History:  Diagnosis Date   Asthma    Diabetes mellitus without complication (HCC)    Hypercholesteremia    Thyroid disease    Past Surgical History:  Procedure Laterality Date   PILONIDAL CYST DRAINAGE  2010   Patient Active Problem List   Diagnosis Date Noted   Type 2 diabetes mellitus with hyperglycemia, without long-term current use of insulin (HCC) 12/21/2021   Chronic fatigue 12/21/2021   Colon cancer screening 12/21/2021   Thyroid disease 12/21/2021   Family history of thyroid disease in mother 12/21/2021    ONSET DATE: 3 years  REFERRING DIAG: U09.9 Long Covid; Chronic fatigue R53.82, decreased activity tolerance R68.89  THERAPY DIAG:  Weakness; difficulty in walking Rationale for Evaluation and Treatment: Rehabilitation  SUBJECTIVE:                                                                                                                                                                                             SUBJECTIVE STATEMENT: Got covid 3 years ago;  body aches and fatigue that never went away; moved from [place] to [place] to be with mother b/c I couldn't work;  bounced around doctors who didn't think long Covid was real; got referral for [facility] and had 1st appt, next appt in July they recommended exercise/activity in a controlled environment; applied for disability; current  activity level: 10-12 hours sleep to function;  4-8 hours out of bed; sore every day all over/all over body aches the more I do the more it hurts;  I will crash if I do too much; brain fog (used to be a big reader) but hard to focus on short articles;  even making phone calls will be tiring; drs appts and grocery store only;  tried to go to church for Easter and had to conserve energy all week in order to go on Sunday  PERTINENT HISTORY: Diabetes mellitus; HTN; PAD  PAIN:   Are  you having pain? Yes NPRS scale: 4/10 Pain location: arms and torso Pain orientation: Right and Left  PAIN TYPE: soreness Pain description: constant  Aggravating factors: activity  Relieving factors: lying down, resting    PRECAUTIONS: None     WEIGHT BEARING RESTRICTIONS: No  FALLS: Has patient fallen in last 6 months? 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   COGNITION: Overall cognitive status: Within functional limits for tasks assessed    POSTURE: No Significant postural limitations  LOWER EXTREMITY ROM:   WFLs  STRENGTH:  grossly 4/5 in Ues and Les;  Decreased activation of transverse abdominus muscles; abdominals 4-/5; decreased activation of lumbar multifidi; trunk extensors 4-/5   GAIT: decreased gait speed; no assistive device used   FUNCTIONAL TESTS:  Able to rise from the chair on 2nd attempt 1x without UE assist but very difficult   PATIENT SURVEYS:  LEFS 41/80  The Patient-Specific Functional Scale  Initial:  I am going to ask you to identify up to 3 important activities that you are unable to do or are having difficulty with as a result of this problem.   Today are there any activities that you are unable to do or having difficulty with because of this?  (Patient shown scale and patient rated each activity)  Follow up: When you first came in you had difficulty performing these activities.  Today do you still have difficulty?  Patient-Specific activity scoring scheme (Point to one number):  0 1 2 3 4 5 6 7 8 9  10 Unable                                                                                                          Able to perform To perform                                                                                                    activity at the same Activity         Level as before  Injury or problem  Activity        Be able to read                                                                         Initial:          2               2.       Take my dog for walk                                                                           Initial:          3               3.         Energy to go out to go to a coffee shop or a movie or church                      2                                                                                                                                                                                            TREATMENT DATE: 11/02/23  Evaluation RPE (Rating of Perceived Exertion): 5-6 /10 Initial HEP below HEP goal:  3 minute walk    PATIENT EDUCATION: Education details: Educated patient on anatomy and physiology of current symptoms, prognosis, plan of care as well as initial self care strategies to promote recovery Person educated: Patient Education method: Explanation Education comprehension: verbalized understanding  HOME EXERCISE PROGRAM: Access Code: [email or web address] URL: [email or web address] Date: 11/02/2023 Prepared by: Darien Eden  Exercises - Supine Diaphragmatic Breathing  - 1 x daily - 7 x weekly - 1 sets - 7 reps - Prone Diaphragmatic Breathing  - 1 x daily - 7 x weekly - 1 sets - 7 reps - Seated Diaphragmatic Breathing  - 1 x daily - 7 x weekly - 1 sets - 10 reps - Hooklying Isometric Hip Flexion with Opposite  Arm  - 1 x daily - 7 x weekly - 1 sets - 5 reps - Seated Eye and Head Movement Coordination - Side to Side  - 1 x daily - 7 x weekly - 1 sets - 5 reps  GOALS: Goals reviewed with patient? Yes  SHORT TERM GOALS: Target date: 12/14/2023        The patient will demonstrate knowledge of basic self care strategies and exercises to promote healing  Baseline: Goal status: INITIAL  2.  The patient will be able to walk for 5 minutes without stopping Baseline:  Goal status: INITIAL  3.  Improved LE strength with patient able to rise from a standard height chair without UE assist with ease Baseline:  Goal status: INITIAL      LONG TERM GOALS: Target date: 01/25/2024    The patient will be independent in a safe self progression of a home exercise program to promote further recovery of function   Baseline:  Goal status: INITIAL  2.  PSFS score for reading improved to 5 Baseline:  Goal status: INITIAL  3.  Patient will be able to  walk her dog PSFS improved to 5 Baseline:  Goal status: INITIAL  4.  Patient will be able to go on an hour long outing for coffee or attend church with PSFS score of 5 Baseline:  Goal status: INITIAL  5.  Lower Extremity Functional Scale improved to   50/80   indicating improved function  Baseline:  Goal status: INITIAL  ASSESSMENT:  CLINICAL IMPRESSION: Patient is a 53 y.o. female who was seen today for physical therapy evaluation and treatment for long covid, chronic fatigue and decreased activity tolerance.  She is limited in her ability to be out of bed more than 4-8 hours a day and therefore is unable to walk her dog and is very limited in her  outings to the grocery store or medical appointments.  She has been unable to work and has filed for disability.  She would benefit from PT to restore movement, build strength and gain endurance by addressing multiple body systems (cardiopulmonary, neurovestibular, musculoskeletal, mental/cognitive).  She will need close supervision and monitoring of a graded exposure to exercise to avoid over-fatigue.    OBJECTIVE IMPAIRMENTS: cardiopulmonary status limiting activity, decreased activity tolerance, decreased balance, decreased cognition, decreased endurance, difficulty walking, decreased strength, impaired perceived functional ability, impaired UE functional use, and pain.   ACTIVITY LIMITATIONS: carrying, lifting, bending, sitting, standing, squatting, stairs, transfers, bed mobility, hygiene/grooming, and locomotion level  PARTICIPATION LIMITATIONS: meal prep, cleaning, laundry, shopping, community activity, and church  PERSONAL FACTORS: Time since onset of injury/illness/exacerbation and 1-2 comorbidities: Diabetes, HTN are also affecting patient's functional outcome.   REHAB POTENTIAL: Good  CLINICAL DECISION MAKING: Evolving/moderate complexity  EVALUATION COMPLEXITY: Moderate  PLAN:  PT FREQUENCY: 1-2x/week  PT DURATION: 12 weeks  PLANNED INTERVENTIONS: 97164- PT Re-evaluation, 97110-Therapeutic exercises, 97530- Therapeutic activity, 97112- Neuromuscular re-education, 97535- Self Care, 16109- Manual therapy, [phone]- Aquatic Therapy, Patient/Family education, Balance training, Cryotherapy, and Moist heat  PLAN FOR NEXT SESSION:3 minute walk test for baseline with RPE; check response to initial HEP; short duration cardio (try Nu-step) low level/low repetition ex's; seated core; monitor RPE and check vital signs to avoid over-fatigue/energy crashes  Darien Eden, PT 11/02/23 4:51 PM Phone: [phone] Fax: [phone]

## 2023-11-04 ENCOUNTER — Ambulatory Visit: Admitting: Rehabilitative and Restorative Service Providers"

## 2023-11-04 ENCOUNTER — Encounter: Payer: Self-pay | Admitting: Rehabilitative and Restorative Service Providers"

## 2023-11-04 DIAGNOSIS — R262 Difficulty in walking, not elsewhere classified: Secondary | ICD-10-CM

## 2023-11-04 DIAGNOSIS — M6281 Muscle weakness (generalized): Secondary | ICD-10-CM | POA: Diagnosis not present

## 2023-11-04 NOTE — Therapy (Signed)
 OUTPATIENT PHYSICAL THERAPY TREATMENT NOTE   Patient Name: Christy Torres MRN: [id number] DOB:11-21-1970, 53 y.o., female Today's Date: 11/04/2023   PCP: Ulyess Gammons NP REFERRING PROVIDER: Elesa Grills FNP  END OF SESSION:  PT End of Session - 11/04/23 1103     Visit Number 2    Date for PT Re-Evaluation 01/25/24    Authorization Type [place] [employer]    Authorization Time Period 11/02/2023 - 12/30/2023    Authorization - Visit Number 2    Authorization - Number of Visits 7    PT Start Time 1101    PT Stop Time 1140    PT Time Calculation (min) 39 min    Activity Tolerance Patient tolerated treatment well    Behavior During Therapy WFL for tasks assessed/performed             Past Medical History:  Diagnosis Date   Asthma    Diabetes mellitus without complication (HCC)    Hypercholesteremia    Thyroid disease    Past Surgical History:  Procedure Laterality Date   PILONIDAL CYST DRAINAGE  2010   Patient Active Problem List   Diagnosis Date Noted   Type 2 diabetes mellitus with hyperglycemia, without long-term current use of insulin (HCC) 12/21/2021   Chronic fatigue 12/21/2021   Colon cancer screening 12/21/2021   Thyroid disease 12/21/2021   Family history of thyroid disease in mother 12/21/2021    ONSET DATE: 3 years  REFERRING DIAG: U09.9 Long Covid; Chronic fatigue R53.82, decreased activity tolerance R68.89  THERAPY DIAG:  Weakness; difficulty in walking Rationale for Evaluation and Treatment: Rehabilitation  SUBJECTIVE:                                                                                                                                                                                             SUBJECTIVE STATEMENT: Patient reports a little soreness today from starting some of the exercises.  PERTINENT HISTORY:  Diabetes mellitus; HTN; PAD; Covid 3 years ago  PAIN:   Are you having pain? Yes NPRS scale:  currently 4/10 Pain location: arms and torso Pain orientation: Right and Left  PAIN TYPE: soreness Pain description: constant  Aggravating factors: activity  Relieving factors: lying down, resting    PRECAUTIONS: None     WEIGHT BEARING RESTRICTIONS: No  FALLS: Has patient fallen in last 6 months? 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   COGNITION: Overall cognitive status: Within functional limits for tasks assessed    POSTURE: No Significant postural limitations  LOWER EXTREMITY ROM:   WFLs  STRENGTH:  grossly 4/5 in Ues and Les;  Decreased activation of transverse abdominus muscles; abdominals 4-/5; decreased activation of lumbar multifidi; trunk extensors 4-/5   GAIT: decreased gait speed; no assistive device used   FUNCTIONAL TESTS:  Eval: Able to rise from the chair on 2nd attempt 1x without UE assist but very difficult   11/04/2023: 5 times sit to/from stand:  33.97 sec Timed Up and Go (TUG):  12.60 sec 3 minute walk test:  497 ft with RPE of 4/10  PATIENT SURVEYS:  LEFS 41/80  The Patient-Specific Functional Scale  Initial:  I am going to ask you to identify up to 3 important activities that you are unable to do or are having difficulty with as a result of this problem.  Today are there any activities that you are unable to do or having difficulty with because of this?  (Patient shown scale and patient rated each activity)  Follow up: When you first came in you had difficulty performing these activities.  Today do you still have difficulty?  Patient-Specific activity scoring scheme (Point to one  number):  0 1 2 3 4 5 6 7 8 9  10 Unable                                                                                                          Able to perform To perform                                                                                                    activity at the same Activity         Level as before                                                                                                                       Injury or problem  Activity  Be able to read                                                                         Initial:          2               2.       Take my dog for walk                                                                           Initial:          3               3.         Energy to go out to go to a coffee shop or a movie or church                      2                                                                                                                                                                                            TREATMENT DATE:  11/04/2023: 5 times sit to/from stand:  33.97 sec Timed Up and Go (TUG):  12.60 sec 3 minute walk test:  497 ft with RPE of 4/10 Seated hip adduction ball squeeze 2x10 Seated transversus abdominus contraction with pressing ball into thighs 2x10 Seated thoracic/lumbar extension with ball behind back 2x10 Seated with 1# ankle weights:  long arc quad, marching, and hip ER.  2x10 each bilat Seated hamstring stretch 2x20 sec bilat Nustep level 3 x4 min with PT present to discuss status   11/02/23  Evaluation RPE (Rating of Perceived Exertion): 5-6 /10 Initial HEP below HEP goal:  3 minute walk    PATIENT EDUCATION: Education details: Educated patient on anatomy and physiology of current symptoms, prognosis, plan of care as well as initial self care strategies to promote recovery Person educated: Patient Education method: Explanation Education comprehension:  verbalized understanding  HOME EXERCISE PROGRAM:  Access Code: [email or web address] URL: [email or web address] Date: 11/04/2023 Prepared by: Chaneta Comer Rocklyn Mayberry  Exercises - Supine Diaphragmatic Breathing  - 1 x daily - 7 x weekly - 1 sets - 7 reps - Prone Diaphragmatic Breathing  - 1 x daily - 7 x weekly - 1 sets - 7 reps - Seated Diaphragmatic Breathing  - 1 x daily - 7 x weekly - 1 sets - 10 reps - Hooklying Isometric Hip Flexion with Opposite Arm  - 1 x daily - 7 x weekly - 1 sets - 5 reps - Seated Eye and Head Movement Coordination - Side to Side  - 1 x daily - 7 x weekly - 1 sets - 5 reps - Seated Long Arc Quad  - 1 x daily - 7 x weekly - 2 sets - 10 reps - Seated March  - 1 x daily - 7 x weekly - 2 sets - 10 reps - Sit to Stand with Armchair  - 1 x daily - 7 x weekly - 2 sets - 5 reps - Seated Hamstring Stretch  - 1 x daily - 7 x weekly - 2 reps - 20 sec hold  GOALS: Goals reviewed with patient? Yes  SHORT TERM GOALS: Target date: 12/14/2023   The patient will demonstrate knowledge of basic self care strategies and exercises to promote healing  Baseline: Goal status: Ongoing  2.  The patient will be able to walk for 5 minutes without stopping Baseline:  Goal status: Ongoing  3.  Improved LE strength with patient able to rise from a standard height chair without UE assist with ease Baseline:  Goal status: Ongoing      LONG TERM GOALS: Target date: 01/25/2024    The patient will be independent in a safe self progression of a home exercise program to promote further recovery of function   Baseline:  Goal status: INITIAL  2.  PSFS score for reading improved to 5 Baseline:  Goal status: INITIAL  3.  Patient will be able to  walk her dog PSFS improved to 5 Baseline:  Goal status: INITIAL  4.  Patient will be able to go on an hour long outing for coffee or attend church with PSFS score of 5 Baseline:  Goal status: INITIAL  5.  Lower Extremity Functional Scale  improved to   50/80   indicating improved function  Baseline:  Goal status: INITIAL  ASSESSMENT:  CLINICAL IMPRESSION: Ms Blackard presents to skilled PT reporting that she has been trying to walk more.  Patient with great participation during session and able to establish baseline measurements for functional assessments.  Patient with some dyspnea noted after 3 minute walk test.  Took multiple seated recovery periods throughout to ensure that patient was not over-taxing her body given her long-covid syndrome.  Patient able to perform Nustep at end of session and reported that it felt good to exercise.  Patient continues to require skilled PT to progress towards goal related activities.  OBJECTIVE IMPAIRMENTS: cardiopulmonary status limiting activity, decreased activity tolerance, decreased balance, decreased cognition, decreased endurance, difficulty walking, decreased strength, impaired perceived functional ability, impaired UE functional use, and pain.   ACTIVITY LIMITATIONS: carrying, lifting, bending, sitting, standing, squatting, stairs, transfers, bed mobility, hygiene/grooming, and locomotion level  PARTICIPATION LIMITATIONS: meal prep, cleaning, laundry, shopping, community activity, and church  PERSONAL FACTORS: Time since onset of injury/illness/exacerbation and 1-2 comorbidities: Diabetes, HTN are also affecting patient's functional outcome.   REHAB POTENTIAL: Good  CLINICAL DECISION MAKING: Evolving/moderate complexity  EVALUATION COMPLEXITY: Moderate  PLAN:  PT FREQUENCY: 1-2x/week  PT DURATION: 12 weeks  PLANNED INTERVENTIONS: 97164- PT Re-evaluation, 97110-Therapeutic exercises, 97530- Therapeutic activity, 97112- Neuromuscular re-education, 97535- Self Care, 78295- Manual therapy, [phone]- Aquatic Therapy, Patient/Family education, Balance training, Cryotherapy, and Moist heat  PLAN FOR NEXT SESSION:  Assess and progress HEP as indicated, strengthening, flexibility,  activity tolerance, manual/dry needling as indicated     Robyne Christen, PT, DPT 11/04/23, 11:53 AM  [facility] [address] [place], [place] [address] Phone # [phone] Fax [phone]

## 2023-11-08 ENCOUNTER — Ambulatory Visit: Admitting: Physical Therapy

## 2023-11-08 DIAGNOSIS — M6281 Muscle weakness (generalized): Secondary | ICD-10-CM | POA: Diagnosis not present

## 2023-11-08 DIAGNOSIS — R262 Difficulty in walking, not elsewhere classified: Secondary | ICD-10-CM

## 2023-11-08 NOTE — Therapy (Signed)
 OUTPATIENT PHYSICAL THERAPY TREATMENT NOTE   Patient Name: Christy Torres MRN: [id number] DOB:1970/06/16, 53 y.o., female Today's Date: 11/08/2023   PCP: Ulyess Gammons NP REFERRING PROVIDER: Elesa Grills FNP  END OF SESSION:  PT End of Session - 11/08/23 1148     Visit Number 3    Date for PT Re-Evaluation 01/25/24    Authorization Type [place] Medicaid Healthy Blue    Authorization Time Period 11/02/2023 - 12/30/2023    Authorization - Visit Number 3    Authorization - Number of Visits 7    PT Start Time 1147    PT Stop Time 1229    PT Time Calculation (min) 42 min    Activity Tolerance Patient tolerated treatment well             Past Medical History:  Diagnosis Date   Asthma    Diabetes mellitus without complication (HCC)    Hypercholesteremia    Thyroid disease    Past Surgical History:  Procedure Laterality Date   PILONIDAL CYST DRAINAGE  2010   Patient Active Problem List   Diagnosis Date Noted   Type 2 diabetes mellitus with hyperglycemia, without long-term current use of insulin (HCC) 12/21/2021   Chronic fatigue 12/21/2021   Colon cancer screening 12/21/2021   Thyroid disease 12/21/2021   Family history of thyroid disease in mother 12/21/2021    ONSET DATE: 3 years  REFERRING DIAG: U09.9 Long Covid; Chronic fatigue R53.82, decreased activity tolerance R68.89  THERAPY DIAG:  Weakness; difficulty in walking Rationale for Evaluation and Treatment: Rehabilitation  SUBJECTIVE:                                                                                                                                                                                             SUBJECTIVE STATEMENT: It seemed easy while I was here but I had to rest all weekend to recover.  I didn't crash but it wiped me out.  PERTINENT HISTORY:  Diabetes mellitus; HTN; PAD; Covid 3 years ago  PAIN:   Are you having pain? Yes NPRS scale: 4/10 Pain location: general all  over body pain: arms and torso Pain orientation: Right and Left  PAIN TYPE: soreness Pain description: constant  Aggravating factors: activity  Relieving factors: lying down, resting    PRECAUTIONS: None     WEIGHT BEARING RESTRICTIONS: No  FALLS: Has patient fallen in last 6 months? 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   COGNITION: Overall cognitive status: Within functional limits for tasks assessed    POSTURE: No Significant postural limitations  LOWER EXTREMITY ROM:   WFLs  STRENGTH:  grossly 4/5 in Ues and Les;  Decreased activation of transverse abdominus muscles; abdominals 4-/5; decreased activation of lumbar multifidi; trunk extensors 4-/5   GAIT: decreased gait speed; no assistive device used   FUNCTIONAL TESTS:  Eval: Able to rise from the chair on 2nd attempt 1x without UE assist but very difficult   11/04/2023: 5 times sit to/from stand:  33.97 sec Timed Up and Go (TUG):  12.60 sec 3 minute walk test:  497 ft with RPE of 4/10  PATIENT SURVEYS:  LEFS 41/80  The Patient-Specific Functional Scale  Initial:  I am going to ask you to identify up to 3 important activities that you are unable to do or are having difficulty with as a result of this problem.  Today are there any activities that you are unable to do or having difficulty with because of this?  (Patient shown scale and patient rated each activity)  Follow up: When you first came in you had difficulty performing these activities.  Today do you still have difficulty?  Patient-Specific activity scoring scheme (Point to one number):  0 1 2 3 4 5 6 7 8 9  10 Unable                                                                                                           Able to perform To perform                                                                                                    activity at the same Activity         Level as before                                                                                                                       Injury or  problem  Activity        Be able to read                                                                         Initial:          2               2.       Take my dog for walk                                                                           Initial:          3               3.         Energy to go out to go to a coffee shop or a movie or church                      2                                                                                                                                                                                            TREATMENT DATE:  11/08/2023: Status update and discussion on energy conservation 12 spoons theory Seated 2# core series:   hip to hip, hip to shoulder, Vs, ear to ear 5x each Seated hip flexion up and over the line 2# resting on thigh Seated heel raise 5x right/left  Seated diaphragmatic breathing 2 minutes Seated hip adduction ball squeeze x10 Seated green band clams x10 Seated yellow band rows 2x10 Sit to stand from high mat table 2 sets of 5 1 min diaphragmatic breathing between sets  Seated thoracic/lumbar extension with ball behind back 2x10 Seated hamstring stretch 2x20 sec bilat RPE (Rating of Perceived Exertion):   6  /10 Nustep (blue machine) level 2 x4 min with PT present to discuss status (60 SPM)  11/04/2023: 5 times sit to/from stand:  33.97 sec Timed Up and Go (TUG):  12.60  sec 3 minute walk test:  497 ft with RPE of 4/10 Seated hip adduction ball squeeze 2x10 Seated transversus abdominus contraction with pressing  ball into thighs 2x10 Seated thoracic/lumbar extension with ball behind back 2x10 Seated with 1# ankle weights:  long arc quad, marching, and hip ER.  2x10 each bilat Seated hamstring stretch 2x20 sec bilat Nustep level 3 x4 min with PT present to discuss status   11/02/23  Evaluation RPE (Rating of Perceived Exertion): 5-6 /10 Initial HEP below HEP goal:  3 minute walk    PATIENT EDUCATION: Education details: Educated patient on anatomy and physiology of current symptoms, prognosis, plan of care as well as initial self care strategies to promote recovery Person educated: Patient Education method: Explanation Education comprehension: verbalized understanding  HOME EXERCISE PROGRAM: Access Code: [email or web address] URL: [email or web address] Date: 11/04/2023 Prepared by: Chaneta Comer Menke  Exercises - Supine Diaphragmatic Breathing  - 1 x daily - 7 x weekly - 1 sets - 7 reps - Prone Diaphragmatic Breathing  - 1 x daily - 7 x weekly - 1 sets - 7 reps - Seated Diaphragmatic Breathing  - 1 x daily - 7 x weekly - 1 sets - 10 reps - Hooklying Isometric Hip Flexion with Opposite Arm  - 1 x daily - 7 x weekly - 1 sets - 5 reps - Seated Eye and Head Movement Coordination - Side to Side  - 1 x daily - 7 x weekly - 1 sets - 5 reps - Seated Long Arc Quad  - 1 x daily - 7 x weekly - 2 sets - 10 reps - Seated March  - 1 x daily - 7 x weekly - 2 sets - 10 reps - Sit to Stand with Armchair  - 1 x daily - 7 x weekly - 2 sets - 5 reps - Seated Hamstring Stretch  - 1 x daily - 7 x weekly - 2 reps - 20 sec hold  GOALS: Goals reviewed with patient? Yes  SHORT TERM GOALS: Target date: 12/14/2023   The patient will demonstrate knowledge of basic self care strategies and exercises to promote healing  Baseline: Goal status: Ongoing  2.  The patient will be able to walk for 5 minutes without stopping Baseline:  Goal status: Ongoing  3.  Improved LE strength with patient able to rise from a  standard height chair without UE assist with ease Baseline:  Goal status: Ongoing      LONG TERM GOALS: Target date: 01/25/2024    The patient will be independent in a safe self progression of a home exercise program to promote further recovery of function   Baseline:  Goal status: INITIAL  2.  PSFS score for reading improved to 5 Baseline:  Goal status: INITIAL  3.  Patient will be able to  walk her dog PSFS improved to 5 Baseline:  Goal status: INITIAL  4.  Patient will be able to go on an hour long outing for coffee or attend church with PSFS score of 5 Baseline:  Goal status: INITIAL  5.  Lower Extremity Functional Scale improved to   50/80   indicating improved function  Baseline:  Goal status: INITIAL  ASSESSMENT:  CLINICAL IMPRESSION: Close supervision to monitor fatigue level to avoid overexertion.  Exercises performed in mostly seated position to scale back the intensity.  Encouraged regular rest breaks b/w exercise sets with diaphragmatic breathing.     OBJECTIVE IMPAIRMENTS: cardiopulmonary status limiting activity, decreased activity tolerance, decreased balance, decreased  cognition, decreased endurance, difficulty walking, decreased strength, impaired perceived functional ability, impaired UE functional use, and pain.   ACTIVITY LIMITATIONS: carrying, lifting, bending, sitting, standing, squatting, stairs, transfers, bed mobility, hygiene/grooming, and locomotion level  PARTICIPATION LIMITATIONS: meal prep, cleaning, laundry, shopping, community activity, and church  PERSONAL FACTORS: Time since onset of injury/illness/exacerbation and 1-2 comorbidities: Diabetes, HTN are also affecting patient's functional outcome.   REHAB POTENTIAL: Good  CLINICAL DECISION MAKING: Evolving/moderate complexity  EVALUATION COMPLEXITY: Moderate  PLAN:  PT FREQUENCY: 1-2x/week  PT DURATION: 12 weeks  PLANNED INTERVENTIONS: 97164- PT Re-evaluation, 97110-Therapeutic  exercises, 97530- Therapeutic activity, 97112- Neuromuscular re-education, 97535- Self Care, 86578- Manual therapy, [phone]- Aquatic Therapy, Patient/Family education, Balance training, Cryotherapy, and Moist heat  PLAN FOR NEXT SESSION:  Assess response to treatment session;  strengthening, flexibility, activity tolerance  Darien Eden, PT 11/08/23 12:24 PM Phone: [phone] Fax: [phone]  [facility] [address], [address] [place], [place] [address] Phone # [phone] Fax [phone]

## 2023-11-15 NOTE — Therapy (Signed)
 OUTPATIENT PHYSICAL THERAPY TREATMENT NOTE   Patient Name: Christy Torres MRN: [id number] DOB:[birth year], 53 y.o., female Today's Date: 11/16/2023   PCP: Ulyess Gammons NP REFERRING PROVIDER: Elesa Grills FNP  END OF SESSION:  PT End of Session - 11/16/23 1435     Visit Number 4    Date for PT Re-Evaluation 01/25/24    Authorization Type [place] Medicaid Healthy Blue    Authorization Time Period 11/02/2023 - 12/30/2023    Authorization - Visit Number 4    Authorization - Number of Visits 7    PT Start Time 1405    PT Stop Time 1443    PT Time Calculation (min) 38 min    Activity Tolerance Patient tolerated treatment well    Behavior During Therapy WFL for tasks assessed/performed              Past Medical History:  Diagnosis Date   Asthma    Diabetes mellitus without complication (HCC)    Hypercholesteremia    Thyroid disease    Past Surgical History:  Procedure Laterality Date   PILONIDAL CYST DRAINAGE  2010   Patient Active Problem List   Diagnosis Date Noted   Type 2 diabetes mellitus with hyperglycemia, without long-term current use of insulin (HCC) 12/21/2021   Chronic fatigue 12/21/2021   Colon cancer screening 12/21/2021   Thyroid disease 12/21/2021   Family history of thyroid disease in mother 12/21/2021    ONSET DATE: 3 years  REFERRING DIAG: U09.9 Long Covid; Chronic fatigue R53.82, decreased activity tolerance R68.89  THERAPY DIAG:  Weakness; difficulty in walking Rationale for Evaluation and Treatment: Rehabilitation  SUBJECTIVE:                                                                                                                                                                                             SUBJECTIVE STATEMENT: I wasn't as sore as the last time. I haven't done everything everyday as far as HEP.  PERTINENT HISTORY:  Diabetes mellitus; HTN; PAD; Covid 3 years ago  PAIN:   Are you having pain? Yes NPRS scale:  3/10 Pain location: general all over body pain: arms and torso Pain orientation: Right and Left  PAIN TYPE: soreness Pain description: constant  Aggravating factors: activity  Relieving factors: lying down, resting    PRECAUTIONS: None     WEIGHT BEARING RESTRICTIONS: No  FALLS: Has patient fallen in last 6 months? 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   COGNITION: Overall cognitive status: Within functional limits for tasks assessed    POSTURE: No Significant postural limitations  LOWER EXTREMITY ROM:   WFLs  STRENGTH:  grossly 4/5 in Ues and Les;  Decreased activation of transverse abdominus muscles; abdominals 4-/5; decreased activation of lumbar multifidi; trunk extensors 4-/5   GAIT: decreased gait speed; no assistive device used   FUNCTIONAL TESTS:  Eval: Able to rise from the chair on 2nd attempt 1x without UE assist but very difficult   11/04/2023: 5 times sit to/from stand:  33.97 sec Timed Up and Go (TUG):  12.60 sec 3 minute walk test:  497 ft with RPE of 4/10  PATIENT SURVEYS:  LEFS 41/80  The Patient-Specific Functional Scale  Initial:  I am going to ask you to identify up to 3 important activities that you are unable to do or are having difficulty with as a result of this problem.  Today are there any activities that you are unable to do or having difficulty with because of this?  (Patient shown scale and patient rated each activity)  Follow up: When you first came in you had difficulty performing these activities.  Today do you still have difficulty?  Patient-Specific activity scoring scheme (Point to one  number):  0 1 2 3 4 5 6 7 8 9  10 Unable                                                                                                          Able to perform To perform                                                                                                    activity at the same Activity         Level as before                                                                                                                       Injury  or problem  Activity        Be able to read                                                                         Initial:          2               2.       Take my dog for walk                                                                           Initial:          3               3.         Energy to go out to go to a coffee shop or a movie or church                      2                                                                                                                                                                                            TREATMENT DATE:  11/16/2023: Nustep (blue machine) level 2 x4 min done at end of session today (60 SPM) Seated 3# core series:   hip to hip, hip to shoulder, Vs, ear to ear 2 x 5 each Seated hip flexion up and over 1/2 foam roller 3# resting on thigh Seated heel raise 2 x5 right/left 3# on thigh Seated hip adduction ball squeeze x10 Seated with 2# ankle weights:  long arc quad, marching, and hip ER.  2x10 each bilat Seated biceps curl to [facility] press 3# x 10 Seated B shoulder flex to 90 deg 3# x 10 Seated yellow red high and low rows x10 ea Seated horizontal ABD red x 10 Seated green band clams 2x10 Sit to stand from mat table with green band around thighs 2 sets of 5 Seated thoracic/lumbar extension with heavy purple  power cord x 10 Wall push ups 2x5 RPE (Rating of Perceived Exertion):   3-4  /10 after all of above exercises Seated hamstring stretch 2x30 sec  bilat   11/08/2023: Status update and discussion on energy conservation 12 spoons theory Seated 2# core series:   hip to hip, hip to shoulder, Vs, ear to ear 5x each Seated hip flexion up and over the line 2# resting on thigh Seated heel raise 5x right/left  Seated diaphragmatic breathing 2 minutes Seated hip adduction ball squeeze x10 Seated green band clams x10 Seated yellow band rows 2x10 Sit to stand from high mat table 2 sets of 5 1 min diaphragmatic breathing between sets  Seated thoracic/lumbar extension with ball behind back 2x10 Seated hamstring stretch 2x20 sec bilat RPE (Rating of Perceived Exertion):   6  /10 Nustep (blue machine) level 2 x4 min with PT present to discuss status (60 SPM)  11/04/2023: 5 times sit to/from stand:  33.97 sec Timed Up and Go (TUG):  12.60 sec 3 minute walk test:  497 ft with RPE of 4/10 Seated hip adduction ball squeeze 2x10 Seated transversus abdominus contraction with pressing ball into thighs 2x10 Seated thoracic/lumbar extension with ball behind back 2x10 Seated with 1# ankle weights:  long arc quad, marching, and hip ER.  2x10 each bilat Seated hamstring stretch 2x20 sec bilat Nustep level 3 x4 min with PT present to discuss status   11/02/23  Evaluation RPE (Rating of Perceived Exertion): 5-6 /10 Initial HEP below HEP goal:  3 minute walk    PATIENT EDUCATION: Education details: Educated patient on anatomy and physiology of current symptoms, prognosis, plan of care as well as initial self care strategies to promote recovery Person educated: Patient Education method: Explanation Education comprehension: verbalized understanding  HOME EXERCISE PROGRAM: Access Code: [email or web address] URL: [email or web address] Date: 11/04/2023 Prepared by: Chaneta Comer Menke  Exercises - Supine Diaphragmatic Breathing  - 1 x daily - 7 x weekly - 1 sets - 7 reps - Prone Diaphragmatic Breathing  - 1 x daily - 7 x weekly - 1 sets - 7 reps -  Seated Diaphragmatic Breathing  - 1 x daily - 7 x weekly - 1 sets - 10 reps - Hooklying Isometric Hip Flexion with Opposite Arm  - 1 x daily - 7 x weekly - 1 sets - 5 reps - Seated Eye and Head Movement Coordination - Side to Side  - 1 x daily - 7 x weekly - 1 sets - 5 reps - Seated Long Arc Quad  - 1 x daily - 7 x weekly - 2 sets - 10 reps - Seated March  - 1 x daily - 7 x weekly - 2 sets - 10 reps - Sit to Stand with Armchair  - 1 x daily - 7 x weekly - 2 sets - 5 reps - Seated Hamstring Stretch  - 1 x daily - 7 x weekly - 2 reps - 20 sec hold  GOALS: Goals reviewed with patient? Yes  SHORT TERM GOALS: Target date: 12/14/2023   The patient will demonstrate knowledge of basic self care strategies and exercises to promote healing  Baseline: Goal status: Ongoing  2.  The patient will be able to walk for 5 minutes without stopping Baseline:  Goal status: Ongoing  3.  Improved LE strength with patient able to rise from a standard height chair without UE assist with ease Baseline:  Goal status: Ongoing      LONG TERM GOALS: Target date:  01/25/2024    The patient will be independent in a safe self progression of a home exercise program to promote further recovery of function   Baseline:  Goal status: INITIAL  2.  PSFS score for reading improved to 5 Baseline:  Goal status: INITIAL  3.  Patient will be able to  walk her dog PSFS improved to 5 Baseline:  Goal status: INITIAL  4.  Patient will be able to go on an hour long outing for coffee or attend church with PSFS score of 5 Baseline:  Goal status: INITIAL  5.  Lower Extremity Functional Scale improved to   50/80   indicating improved function  Baseline:  Goal status: INITIAL  ASSESSMENT:  CLINICAL IMPRESSION: Patient reports less soreness after last visit. She tolerated all exercises today and was able to increase weights as well. We also added in more UE TE. RPE was 3-4/10 versus 6/10 after last session. Patient  status monitored by PT throughout session. She continues to demonstrate potential for improvement and would benefit from continued skilled therapy to address impairments.     OBJECTIVE IMPAIRMENTS: cardiopulmonary status limiting activity, decreased activity tolerance, decreased balance, decreased cognition, decreased endurance, difficulty walking, decreased strength, impaired perceived functional ability, impaired UE functional use, and pain.   ACTIVITY LIMITATIONS: carrying, lifting, bending, sitting, standing, squatting, stairs, transfers, bed mobility, hygiene/grooming, and locomotion level  PARTICIPATION LIMITATIONS: meal prep, cleaning, laundry, shopping, community activity, and church  PERSONAL FACTORS: Time since onset of injury/illness/exacerbation and 1-2 comorbidities: Diabetes, HTN are also affecting patient's functional outcome.   REHAB POTENTIAL: Good  CLINICAL DECISION MAKING: Evolving/moderate complexity  EVALUATION COMPLEXITY: Moderate  PLAN:  PT FREQUENCY: 1-2x/week  PT DURATION: 12 weeks  PLANNED INTERVENTIONS: 97164- PT Re-evaluation, 97110-Therapeutic exercises, 97530- Therapeutic activity, 97112- Neuromuscular re-education, 97535- Self Care, 82956- Manual therapy, [id number]- Aquatic Therapy, Patient/Family education, Balance training, Cryotherapy, and Moist heat  PLAN FOR NEXT SESSION:  Assess response to treatment session;  strengthening, flexibility, activity tolerance  Jinx Mourning, PT  11/16/23 2:48 PM Phone: [phone] Fax: [phone]  [facility] [address], [address] [place], [place] [address] Phone # [phone] Fax [phone]

## 2023-11-16 ENCOUNTER — Ambulatory Visit: Attending: Family Medicine | Admitting: Physical Therapy

## 2023-11-16 ENCOUNTER — Encounter: Payer: Self-pay | Admitting: Physical Therapy

## 2023-11-16 DIAGNOSIS — R262 Difficulty in walking, not elsewhere classified: Secondary | ICD-10-CM | POA: Insufficient documentation

## 2023-11-16 DIAGNOSIS — M6281 Muscle weakness (generalized): Secondary | ICD-10-CM | POA: Insufficient documentation

## 2023-11-21 ENCOUNTER — Ambulatory Visit: Admitting: Physical Therapy

## 2023-11-27 NOTE — Therapy (Signed)
 OUTPATIENT PHYSICAL THERAPY TREATMENT NOTE   Patient Name: Christy Torres MRN: [id number] DOB:1970/08/07, 53 y.o., female Today's Date: 11/28/2023   PCP: Ulyess Gammons NP REFERRING PROVIDER: Elesa Grills FNP  END OF SESSION:  PT End of Session - 11/28/23 1535     Visit Number 5    Date for PT Re-Evaluation 01/25/24    Authorization Type [place] [employer]    Authorization Time Period 11/02/2023 - 12/30/2023    Authorization - Visit Number 5    Authorization - Number of Visits 7    PT Start Time 1535    PT Stop Time 1616    PT Time Calculation (min) 41 min    Activity Tolerance Patient tolerated treatment well    Behavior During Therapy WFL for tasks assessed/performed            Past Medical History:  Diagnosis Date   Asthma    Diabetes mellitus without complication (HCC)    Hypercholesteremia    Thyroid disease    Past Surgical History:  Procedure Laterality Date   PILONIDAL CYST DRAINAGE  2010   Patient Active Problem List   Diagnosis Date Noted   Type 2 diabetes mellitus with hyperglycemia, without long-term current use of insulin (HCC) 12/21/2021   Chronic fatigue 12/21/2021   Colon cancer screening 12/21/2021   Thyroid disease 12/21/2021   Family history of thyroid disease in mother 12/21/2021    ONSET DATE: 3 years  REFERRING DIAG: U09.9 Long Covid; Chronic fatigue R53.82, decreased activity tolerance R68.89  THERAPY DIAG:  Weakness; difficulty in walking Rationale for Evaluation and Treatment: Rehabilitation  SUBJECTIVE:                                                                                                                                                                                             SUBJECTIVE STATEMENT: Patient states she was not sore at all after last visit. She has been very busy due to her mother being diagnosed with colon cancer. She has been moving furniture and beds to get a new bed in for her mom.    PERTINENT HISTORY:  Diabetes mellitus; HTN; PAD; Covid 3 years ago  PAIN:   Are you having pain? Yes NPRS scale: 3/10 Pain location: general all over body pain: arms and torso Pain orientation: Right and Left  PAIN TYPE: soreness Pain description: constant  Aggravating factors: activity  Relieving factors: lying down, resting    PRECAUTIONS: None     WEIGHT BEARING RESTRICTIONS: No  FALLS: Has patient fallen in last 6 months? 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   COGNITION: Overall cognitive status: Within functional limits for tasks assessed    POSTURE: No Significant postural limitations  LOWER EXTREMITY ROM:   WFLs  STRENGTH:  grossly 4/5 in Ues and Les;  Decreased activation of transverse abdominus muscles; abdominals 4-/5; decreased activation of lumbar multifidi; trunk extensors 4-/5   GAIT: decreased gait speed; no assistive device used   FUNCTIONAL TESTS:  Eval: Able to rise from the chair on 2nd attempt 1x without UE assist but very difficult   11/04/2023: 5 times sit to/from stand:  33.97 sec Timed Up and Go (TUG):  12.60 sec 3 minute walk test:  497 ft with RPE of 4/10  11/28/23: 5 min walk test: 868 ft RPE 4-5/10  PATIENT SURVEYS:  LEFS 41/80  The Patient-Specific Functional Scale  Initial:  I am going to ask you to identify up to 3 important activities that you are unable to do or are having difficulty with as a result of this problem.  Today are there any activities that you are unable to do or having difficulty with because of this?  (Patient shown scale and patient rated each activity)  Follow up: When you first  came in you had difficulty performing these activities.  Today do you still have difficulty?  Patient-Specific activity scoring scheme (Point to one number):  0 1 2 3 4 5 6 7 8 9  10 Unable                                                                                                          Able to perform To perform                                                                                                    activity at the same Activity         Level as before  Injury or problem  Activity        Be able to read                                                                         Initial:          2               2.       Take my dog for walk                                                                           Initial:          3               3.         Energy to go out to go to a coffee shop or a movie or church                      2                                                                                                                                                                                            TREATMENT DATE:  11/16/2023: 5 min walk test -  868 ft Seated 3# core series:   hip to hip, hip to shoulder, Vs, ear to ear x 10 each Seated hip flexion up and over 1/2 foam roller 3# resting on thigh x 10 ea Seated heel raise 2 x 10 right/left 3# on thigh Seated hip adduction ball squeeze x10 Seated with 2# ankle weights:  long arc quad, marching, and hip ER.  2x10 each bilat Seated biceps curl to [facility] press 3# x 10 Seated B shoulder flex to 90 deg 3# x 10 Seated red high and low rows 2x10 ea (try green next time) Seated horizontal ABD red 2 x 10 (try green) Seated green band clams 2x10 Sit to stand from mat table 3# wts in hands 2 sets of 5 Seated thoracic/lumbar extension with  heavy purple power cord x 10 RPE (Rating of Perceived  Exertion):   3-4  /10 after all of above exercises Seated hamstring stretch 2x30 sec bilat Seated fig 4 2x30 sec B Nustep (blue machine) level 2 x3 min done at end of session today (60 SPM)  11/16/2023: Nustep (blue machine) level 2 x4 min done at end of session today (60 SPM) Seated 3# core series:   hip to hip, hip to shoulder, Vs, ear to ear 2 x 5 each Seated hip flexion up and over 1/2 foam roller 3# resting on thigh Seated heel raise 2 x5 right/left 3# on thigh Seated hip adduction ball squeeze x10 Seated with 2# ankle weights:  long arc quad, marching, and hip ER.  2x10 each bilat Seated biceps curl to [facility] press 3# x 10 Seated B shoulder flex to 90 deg 3# x 10 Seated yellow red high and low rows x10 ea Seated horizontal ABD red x 10 Seated green band clams 2x10 Sit to stand from mat table with green band around thighs 2 sets of 5 Seated thoracic/lumbar extension with heavy purple power cord x 10 Wall push ups 2x5 RPE (Rating of Perceived Exertion):   3-4  /10 after all of above exercises Seated hamstring stretch 2x30 sec bilat   11/08/2023: Status update and discussion on energy conservation 12 spoons theory Seated 2# core series:   hip to hip, hip to shoulder, Vs, ear to ear 5x each Seated hip flexion up and over the line 2# resting on thigh Seated heel raise 5x right/left  Seated diaphragmatic breathing 2 minutes Seated hip adduction ball squeeze x10 Seated green band clams x10 Seated yellow band rows 2x10 Sit to stand from high mat table 2 sets of 5 1 min diaphragmatic breathing between sets  Seated thoracic/lumbar extension with ball behind back 2x10 Seated hamstring stretch 2x20 sec bilat RPE (Rating of Perceived Exertion):   6  /10 Nustep (blue machine) level 2 x4 min with PT present to discuss status (60 SPM)  11/04/2023: 5 times sit to/from stand:  33.97 sec Timed Up and Go (TUG):  12.60 sec 3 minute walk test:  497 ft with RPE of 4/10 Seated hip adduction ball  squeeze 2x10 Seated transversus abdominus contraction with pressing ball into thighs 2x10 Seated thoracic/lumbar extension with ball behind back 2x10 Seated with 1# ankle weights:  long arc quad, marching, and hip ER.  2x10 each bilat Seated hamstring stretch 2x20 sec bilat Nustep level 3 x4 min with PT present to discuss status   11/02/23  Evaluation RPE (Rating of Perceived Exertion): 5-6 /10 Initial HEP below HEP goal:  3 minute walk    PATIENT EDUCATION: Education details: Educated patient on anatomy and physiology of current symptoms, prognosis, plan of care as well as initial self care strategies to promote recovery Person educated: Patient Education method: Explanation Education comprehension: verbalized understanding  HOME EXERCISE PROGRAM: Access Code: [email or web address] URL: [email or web address] Date: 11/04/2023 Prepared by: Chaneta Comer Menke  Exercises - Supine Diaphragmatic Breathing  - 1 x daily - 7 x weekly - 1 sets - 7 reps - Prone Diaphragmatic Breathing  - 1 x daily - 7 x weekly - 1 sets - 7 reps - Seated Diaphragmatic Breathing  - 1 x daily - 7 x weekly - 1 sets - 10 reps - Hooklying Isometric Hip Flexion with Opposite Arm  - 1 x daily - 7 x weekly - 1 sets - 5 reps - Seated Eye and Head Movement Coordination -  Side to Side  - 1 x daily - 7 x weekly - 1 sets - 5 reps - Seated Long Arc Quad  - 1 x daily - 7 x weekly - 2 sets - 10 reps - Seated March  - 1 x daily - 7 x weekly - 2 sets - 10 reps - Sit to Stand with Armchair  - 1 x daily - 7 x weekly - 2 sets - 5 reps - Seated Hamstring Stretch  - 1 x daily - 7 x weekly - 2 reps - 20 sec hold  GOALS: Goals reviewed with patient? Yes  SHORT TERM GOALS: Target date: 12/14/2023   The patient will demonstrate knowledge of basic self care strategies and exercises to promote healing  Baseline: Goal status: Ongoing  2.  The patient will be able to walk for 5 minutes without stopping Baseline:  Goal status: MET  11/28/23  3.  Improved LE strength with patient able to rise from a standard height chair without UE assist with ease Baseline:  Goal status: MET 6/16/      LONG TERM GOALS: Target date: 01/25/2024    The patient will be independent in a safe self progression of a home exercise program to promote further recovery of function   Baseline:  Goal status: INITIAL  2.  PSFS score for reading improved to 5 Baseline:  Goal status: INITIAL  3.  Patient will be able to  walk her dog PSFS improved to 5 Baseline:  Goal status: INITIAL  4.  Patient will be able to go on an hour long outing for coffee or attend church with PSFS score of 5 Baseline:  Goal status: INITIAL  5.  Lower Extremity Functional Scale improved to   50/80   indicating improved function  Baseline:  Goal status: INITIAL  ASSESSMENT:  CLINICAL IMPRESSION: Patient reports she did not have any soreness after last visit. She has met her sit to stand and 5 min walk short term goals. Able to tolerate all TE today, some with increased reps. She has had some life changes at home as her mother was diagnosed with colon cancer last week. This has caused her to have to be a lot more active with running her mother to appointments in addition to moving furniture at home to accommodate anew bed. She reports fatigue after all of this, but not extreme. Patient status monitored by PT throughout session. She continues to demonstrate potential for improvement and would benefit from continued skilled therapy to address impairments.     OBJECTIVE IMPAIRMENTS: cardiopulmonary status limiting activity, decreased activity tolerance, decreased balance, decreased cognition, decreased endurance, difficulty walking, decreased strength, impaired perceived functional ability, impaired UE functional use, and pain.   ACTIVITY LIMITATIONS: carrying, lifting, bending, sitting, standing, squatting, stairs, transfers, bed mobility, hygiene/grooming, and  locomotion level  PARTICIPATION LIMITATIONS: meal prep, cleaning, laundry, shopping, community activity, and church  PERSONAL FACTORS: Time since onset of injury/illness/exacerbation and 1-2 comorbidities: Diabetes, HTN are also affecting patient's functional outcome.   REHAB POTENTIAL: Good  CLINICAL DECISION MAKING: Evolving/moderate complexity  EVALUATION COMPLEXITY: Moderate  PLAN:  PT FREQUENCY: 1-2x/week  PT DURATION: 12 weeks  PLANNED INTERVENTIONS: 97164- PT Re-evaluation, 97110-Therapeutic exercises, 97530- Therapeutic activity, 97112- Neuromuscular re-education, 97535- Self Care, 62130- Manual therapy, [phone]- Aquatic Therapy, Patient/Family education, Balance training, Cryotherapy, and Moist heat  PLAN FOR NEXT SESSION:  Two more approved visits;  Assess response to treatment session;  strengthening, flexibility, activity tolerance  Jinx Mourning, PT  11/28/23  4:25 PM Phone: [phone] Fax: [phone]  [facility] [address] [place], [place] [address] Phone # [phone] Fax [phone]

## 2023-11-28 ENCOUNTER — Encounter: Payer: Self-pay | Admitting: Physical Therapy

## 2023-11-28 ENCOUNTER — Ambulatory Visit: Admitting: Physical Therapy

## 2023-11-28 DIAGNOSIS — M6281 Muscle weakness (generalized): Secondary | ICD-10-CM

## 2023-11-28 DIAGNOSIS — R262 Difficulty in walking, not elsewhere classified: Secondary | ICD-10-CM

## 2023-12-04 NOTE — Therapy (Signed)
 OUTPATIENT PHYSICAL THERAPY TREATMENT NOTE   Patient Name: Christy Torres MRN: [id number] DOB:08/01/1970, 53 y.o., female Today's Date: 12/05/2023   PCP: Kip Ade NP REFERRING PROVIDER: Dario Daniels FNP  END OF SESSION:  PT End of Session - 12/05/23 1359     Visit Number 6    Date for PT Re-Evaluation 01/25/24    Authorization Type [place]    Authorization Time Period 11/02/2023 - 12/30/2023    Authorization - Visit Number 6    Authorization - Number of Visits 7    PT Start Time 1359    PT Stop Time 1444    PT Time Calculation (min) 45 min    Activity Tolerance Patient tolerated treatment well    Behavior During Therapy WFL for tasks assessed/performed             Past Medical History:  Diagnosis Date   Asthma    Diabetes mellitus without complication (HCC)    Hypercholesteremia    Thyroid disease    Past Surgical History:  Procedure Laterality Date   PILONIDAL CYST DRAINAGE  2010   Patient Active Problem List   Diagnosis Date Noted   Type 2 diabetes mellitus with hyperglycemia, without long-term current use of insulin (HCC) 12/21/2021   Chronic fatigue 12/21/2021   Colon cancer screening 12/21/2021   Thyroid disease 12/21/2021   Family history of thyroid disease in mother 12/21/2021    ONSET DATE: 3 years  REFERRING DIAG: U09.9 Long Covid; Chronic fatigue R53.82, decreased activity tolerance R68.89  THERAPY DIAG:  Weakness; difficulty in walking Rationale for Evaluation and Treatment: Rehabilitation  SUBJECTIVE:                                                                                                                                                                                             SUBJECTIVE STATEMENT: I have been running my mom around to all kinds of appointments. Requiring more naps lately.   PERTINENT HISTORY:  Diabetes mellitus; HTN; PAD; Covid 3 years ago  PAIN:   Are you having pain?  Yes NPRS scale: 4/10 Pain location: general all over body pain: arms and torso Pain orientation: Right and Left  PAIN TYPE: soreness Pain description: constant  Aggravating factors: activity  Relieving factors: lying down, resting    PRECAUTIONS: None     WEIGHT BEARING RESTRICTIONS: No  FALLS: Has patient fallen in last 6 months? 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   COGNITION: Overall cognitive status: Within functional limits for tasks assessed    POSTURE: No Significant postural limitations  LOWER EXTREMITY ROM:   WFLs  STRENGTH:  grossly 4/5 in Ues and Les;  Decreased activation of transverse abdominus muscles; abdominals 4-/5; decreased activation of lumbar multifidi; trunk extensors 4-/5   GAIT: decreased gait speed; no assistive device used   FUNCTIONAL TESTS:  Eval: Able to rise from the chair on 2nd attempt 1x without UE assist but very difficult   11/04/2023: 5 times sit to/from stand:  33.97 sec   Timed Up and Go (TUG):  12.60 sec 3 minute walk test:  497 ft with RPE of 4/10  11/28/23: 5 min walk test: 868 ft RPE 4-5/10  12/05/23  5XSTS 12/05/23  18.63 sec  PATIENT SURVEYS:  LEFS 41/80 eval LEFS  42/80     12/05/23  The Patient-Specific Functional Scale  Initial:  I am going to ask you to identify up to 3 important activities that you are unable to do or are having difficulty with as a result of this problem.  Today are there any activities that you are unable to do or having difficulty with because of this?  (Patient shown scale and patient rated each activity)  Follow up: When you first came in you had difficulty  performing these activities.  Today do you still have difficulty?  Patient-Specific activity scoring scheme (Point to one number):  0 1 2 3 4 5 6 7 8 9  10 Unable                                                                                                          Able to perform To perform                                                                                                    activity at the same Activity         Level as before  Injury or problem  Activity        Be able to read                                                                         Initial:          2               2.       Take my dog for walk                                                                           Initial:          3               3.         Energy to go out to go to a coffee shop or a movie or church            Initial:           2             f/u   12/05/23 3                                                                                                                                                                              TREATMENT DATE:  12/05/2023:  PSFS and 5XSTS and LEFS completed Seated 3# core series:   hip to hip, hip to shoulder, Vs, ear to ear 2 x 10 each Seated hip flexion up and over 1/2 foam roller 3# resting on thigh x 10 ea, then hurdle x 10 ea (increase weight next time and use hurdle) Seated heel raise 2 x 10 right/left 3# on thigh Seated hip adduction ball squeeze 2x10 Seated with 2# ankle weights:  long arc quad, marching, and hip ER.  1x10 each bilat Seated biceps curl to [facility] press 3# x 10 Seated B shoulder flex to 90 deg 3# x 10 Seated red high and low rows 1x10 ea  Seated horizontal ABD green 1 x 10  Seated green band clams 2x10 Sit to stand from mat table  3# wts in hands x 10 Seated thoracic/lumbar extension with heavy purple power cord  x 10 RPE (Rating of Perceived Exertion):   4-5  /10 after all of above exercises Seated fig 4 2x30 sec B Resisted walking 5# x 5 in 4 directions Nustep (blue machine) level 2 x 5 min done at end of session today (60 SPM) RPE 5/10   11/28/2023: 5 min walk test -  868 ft Seated 3# core series:   hip to hip, hip to shoulder, Vs, ear to ear x 10 each Seated hip flexion up and over 1/2 foam roller 3# resting on thigh x 10 ea Seated heel raise 2 x 10 right/left 3# on thigh Seated hip adduction ball squeeze x10 Seated with 2# ankle weights:  long arc quad, marching, and hip ER.  2x10 each bilat Seated biceps curl to [facility] press 3# x 10 Seated B shoulder flex to 90 deg 3# x 10 Seated red high and low rows 2x10 ea (try green next time) Seated horizontal ABD red 2 x 10 (try green) Seated green band clams 2x10 Sit to stand from mat table 3# wts in hands 2 sets of 5 Seated thoracic/lumbar extension with heavy purple power cord x 10 RPE (Rating of Perceived Exertion):   3-4  /10 after all of above exercises Seated hamstring stretch 2x30 sec bilat Seated fig 4 2x30 sec B Nustep (blue machine) level 2 x3 min done at end of session today (60 SPM)  11/16/2023: Nustep (blue machine) level 2 x4 min done at end of session today (60 SPM) Seated 3# core series:   hip to hip, hip to shoulder, Vs, ear to ear 2 x 5 each Seated hip flexion up and over 1/2 foam roller 3# resting on thigh Seated heel raise 2 x5 right/left 3# on thigh Seated hip adduction ball squeeze x10 Seated with 2# ankle weights:  long arc quad, marching, and hip ER.  2x10 each bilat Seated biceps curl to [facility] press 3# x 10 Seated B shoulder flex to 90 deg 3# x 10 Seated yellow red high and low rows x10 ea Seated horizontal ABD red x 10 Seated green band clams 2x10 Sit to stand from mat table with green band around thighs 2 sets of 5 Seated thoracic/lumbar extension with heavy purple power cord x 10 Wall push ups 2x5 RPE (Rating of  Perceived Exertion):   3-4  /10 after all of above exercises Seated hamstring stretch 2x30 sec bilat      PATIENT EDUCATION: Education details: Educated patient on anatomy and physiology of current symptoms, prognosis, plan of care as well as initial self care strategies to promote recovery Person educated: Patient Education method: Explanation Education comprehension: verbalized understanding  HOME EXERCISE PROGRAM: Access Code: [email or web address] URL: [email or web address] Date: 11/04/2023 Prepared by: Jarrell Menke  Exercises - Supine Diaphragmatic Breathing  - 1 x daily - 7 x weekly - 1 sets - 7 reps - Prone Diaphragmatic Breathing  - 1 x daily - 7 x weekly - 1 sets - 7 reps - Seated Diaphragmatic Breathing  - 1 x daily - 7 x weekly - 1 sets - 10 reps - Hooklying Isometric Hip Flexion with Opposite Arm  - 1 x daily - 7 x weekly - 1 sets - 5 reps - Seated Eye and Head Movement Coordination - Side to Side  - 1 x daily - 7 x weekly - 1 sets - 5 reps - Seated Long Arc Quad  - 1 x  daily - 7 x weekly - 2 sets - 10 reps - Seated March  - 1 x daily - 7 x weekly - 2 sets - 10 reps - Sit to Stand with Armchair  - 1 x daily - 7 x weekly - 2 sets - 5 reps - Seated Hamstring Stretch  - 1 x daily - 7 x weekly - 2 reps - 20 sec hold  GOALS: Goals reviewed with patient? Yes  SHORT TERM GOALS: Target date: 12/14/2023   The patient will demonstrate knowledge of basic self care strategies and exercises to promote healing  Baseline: Goal status: Ongoing  2.  The patient will be able to walk for 5 minutes without stopping Baseline:  Goal status: MET 11/28/23  3.  Improved LE strength with patient able to rise from a standard height chair without UE assist with ease Baseline:  Goal status: MET 6/16/      LONG TERM GOALS: Target date: 01/25/2024    The patient will be independent in a safe self progression of a home exercise program to promote further recovery of function    Baseline:  Goal status: IN PROGRESS  2.  PSFS score for reading improved to 5 Baseline:  Goal status:  IN PROGRESS  3.  Patient will be able to  walk her dog PSFS improved to 5 Baseline:  Goal status: IN PROGRESS  4.  Patient will be able to go on an hour long outing for coffee or attend church with PSFS score of 5 Baseline:  Goal status: IN PROGRESS  5.  Lower Extremity Functional Scale improved to   50/80   indicating improved function  Baseline:  Goal status: IN PROGRESS  ASSESSMENT:  CLINICAL IMPRESSION:   Patient is making progress with her tolerance to exercise and endurance. She has met two of her STG and is able to walk 5 min without rest and perform sit to stand with no UE support with ease.  She was also able to increase her time on the Nustep today from 3 min to 5 min. Her PSFS has improved one point for activity level and her LEFS has improved one point as well. She has been having to increase her activity levels in the past 2 weeks due to caring for her mother and this has exhausted her she reports. It has also limited her compliance to her HEP. She has had to increase her frequency of naps, but has also been able to be up on her feet more. She is showing good progress in the clinic with increasing reps and resistance. She continues to demonstrate potential for improvement and would benefit from continued skilled therapy to address impairments.     OBJECTIVE IMPAIRMENTS: cardiopulmonary status limiting activity, decreased activity tolerance, decreased balance, decreased cognition, decreased endurance, difficulty walking, decreased strength, impaired perceived functional ability, impaired UE functional use, and pain.   ACTIVITY LIMITATIONS: carrying, lifting, bending, sitting, standing, squatting, stairs, transfers, bed mobility, hygiene/grooming, and locomotion level  PARTICIPATION LIMITATIONS: meal prep, cleaning, laundry, shopping, community activity, and  church  PERSONAL FACTORS: Time since onset of injury/illness/exacerbation and 1-2 comorbidities: Diabetes, HTN are also affecting patient's functional outcome.   REHAB POTENTIAL: Good  CLINICAL DECISION MAKING: Evolving/moderate complexity  EVALUATION COMPLEXITY: Moderate  PLAN:  PT FREQUENCY: 1-2x/week  PT DURATION: 12 weeks  PLANNED INTERVENTIONS: 97164- PT Re-evaluation, 97110-Therapeutic exercises, 97530- Therapeutic activity, [id number]- Neuromuscular re-education, 97535- Self Care, [id number]- Manual therapy, [id number]- Aquatic Therapy, Patient/Family education, Balance training, Cryotherapy, and Moist heat  PLAN FOR NEXT SESSION:  One more approved visits (re-auth submitted 6/23);  Assess response to treatment session;  strengthening, flexibility, activity tolerance  Mliss Cummins, PT  12/05/23 2:46 PM Phone: [phone] Fax: [phone]  [facility] [address] [place], [place] [address] Phone # [phone] Fax [phone]

## 2023-12-05 ENCOUNTER — Encounter: Payer: Self-pay | Admitting: Physical Therapy

## 2023-12-05 ENCOUNTER — Ambulatory Visit: Admitting: Physical Therapy

## 2023-12-05 DIAGNOSIS — M6281 Muscle weakness (generalized): Secondary | ICD-10-CM

## 2023-12-05 DIAGNOSIS — R262 Difficulty in walking, not elsewhere classified: Secondary | ICD-10-CM

## 2023-12-11 NOTE — Therapy (Signed)
 OUTPATIENT PHYSICAL THERAPY TREATMENT NOTE   Patient Name: Christy Torres MRN: [id number] DOB:July 08, 1970, 53 y.o., female Today's Date: 12/12/2023   PCP: Kip Ade NP REFERRING PROVIDER: Dario Daniels FNP  END OF SESSION:  PT End of Session - 12/12/23 1534     Visit Number 7    Date for PT Re-Evaluation 01/25/24    Authorization Type [place] [employer]    Authorization Time Period [employer] Approved 4 visits-12/19/2023-01/17/2024-auth#[id number]    Authorization - Visit Number 7    Authorization - Number of Visits 7    PT Start Time 1534    PT Stop Time 1616    PT Time Calculation (min) 42 min    Activity Tolerance Patient tolerated treatment well    Behavior During Therapy WFL for tasks assessed/performed              Past Medical History:  Diagnosis Date   Asthma    Diabetes mellitus without complication (HCC)    Hypercholesteremia    Thyroid disease    Past Surgical History:  Procedure Laterality Date   PILONIDAL CYST DRAINAGE  2010   Patient Active Problem List   Diagnosis Date Noted   Type 2 diabetes mellitus with hyperglycemia, without long-term current use of insulin (HCC) 12/21/2021   Chronic fatigue 12/21/2021   Colon cancer screening 12/21/2021   Thyroid disease 12/21/2021   Family history of thyroid disease in mother 12/21/2021    ONSET DATE: 3 years  REFERRING DIAG: U09.9 Long Covid; Chronic fatigue R53.82, decreased activity tolerance R68.89  THERAPY DIAG:  Weakness; difficulty in walking Rationale for Evaluation and Treatment: Rehabilitation  SUBJECTIVE:                                                                                                                                                                                             SUBJECTIVE STATEMENT: Doing pretty well.  PERTINENT HISTORY:  Diabetes mellitus; HTN; PAD; Covid 3 years ago  PAIN:   Are you having pain? Yes NPRS scale: 4/10 Pain location:  general all over body pain: arms and torso Pain orientation: Right and Left  PAIN TYPE: soreness Pain description: constant  Aggravating factors: activity  Relieving factors: lying down, resting    PRECAUTIONS: None     WEIGHT BEARING RESTRICTIONS: No  FALLS: Has patient fallen in last 6 months? 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   COGNITION: Overall cognitive status: Within functional limits for tasks assessed    POSTURE: No Significant postural limitations  LOWER EXTREMITY ROM:   WFLs  STRENGTH:  grossly 4/5 in Ues and Les;  Decreased activation of transverse abdominus muscles; abdominals 4-/5; decreased activation of lumbar multifidi; trunk extensors 4-/5   GAIT: decreased gait speed; no assistive device used   FUNCTIONAL TESTS:  Eval: Able to rise from the chair on 2nd attempt 1x without UE assist but very difficult   11/04/2023: 5 times sit to/from stand:  33.97 sec   Timed Up and Go (TUG):  12.60 sec 3 minute walk test:  497 ft with RPE of 4/10  11/28/23: 5 min walk test: 868 ft RPE 4-5/10  12/05/23  5XSTS 12/05/23  18.63 sec  PATIENT SURVEYS:  LEFS 41/80 eval LEFS  42/80     12/05/23  The Patient-Specific Functional Scale  Initial:  I am going to ask you to identify up to 3 important activities that you are unable to do or are having difficulty with as a result of this problem.  Today are there any activities that you are unable to do or having difficulty with because of this?  (Patient shown scale and patient rated each activity)  Follow up: When you first came in you had difficulty performing these activities.  Today do you  still have difficulty?  Patient-Specific activity scoring scheme (Point to one number):  0 1 2 3 4 5 6 7 8 9  10 Unable                                                                                                          Able to perform To perform                                                                                                    activity at the same Activity         Level as before  Injury or problem  Activity        Be able to read                                                                         Initial:          2               2.       Take my dog for walk                                                                           Initial:          3               3.         Energy to go out to go to a coffee shop or a movie or church            Initial:           2             f/u   12/05/23 3                                                                                                                                                                              TREATMENT DATE:  12/12/2023:  Seated 3# core series:   hip to hip, hip to shoulder, Vs, ear to ear 2 x 10 each Seated hip flexion up and over 1/2 foam roller 3# resting on thigh over hurdle x 10 ea (increase weight next) Standing heel raise 2 x 10  Standing marching 2x10, hip ABD 2x10 B Side stepping 10 steps x 4 with green around ankles Functional squat x 10 Seated hip adduction ball squeeze 2x10 Seated biceps curl to [facility] press 3# 2 x 10 Seated B shoulder flex to 90 deg 3# 2 x 10 Sit to stand from mat table 3# wts in hands x 10 Resisted walking 5# x 5 in 4 directions Step up/down 6 inch step x 10 B RPE (Rating of Perceived Exertion):   4-5/10 after all  of above exercises Nu step (blue machine) level 2 x 6 min done at end of session today (76 SPM) RPE 4-5/10 at  end  12/05/2023:  PSFS and 5XSTS and LEFS completed Seated 3# core series:   hip to hip, hip to shoulder, Vs, ear to ear 2 x 10 each Seated hip flexion up and over 1/2 foam roller 3# resting on thigh x 10 ea, then hurdle x 10 ea (increase weight next time and use hurdle) Seated heel raise 2 x 10 right/left 3# on thigh Seated hip adduction ball squeeze 2x10 Seated with 2# ankle weights:  long arc quad, marching, and hip ER.  1x10 each bilat Seated biceps curl to [facility] press 3# x 10 Seated B shoulder flex to 90 deg 3# x 10 Seated red high and low rows 1x10 ea  Seated horizontal ABD green 1 x 10  Seated green band clams 2x10 Sit to stand from mat table 3# wts in hands x 10 Seated thoracic/lumbar extension with heavy purple power cord x 10 RPE (Rating of Perceived Exertion):   4-5  /10 after all of above exercises Seated fig 4 2x30 sec B Resisted walking 5# x 5 in 4 directions Nustep (blue machine) level 2 x 5 min done at end of session today (60 SPM) RPE 5/10   11/28/2023: 5 min walk test -  868 ft Seated 3# core series:   hip to hip, hip to shoulder, Vs, ear to ear x 10 each Seated hip flexion up and over 1/2 foam roller 3# resting on thigh x 10 ea Seated heel raise 2 x 10 right/left 3# on thigh Seated hip adduction ball squeeze x10 Seated with 2# ankle weights:  long arc quad, marching, and hip ER.  2x10 each bilat Seated biceps curl to [facility] press 3# x 10 Seated B shoulder flex to 90 deg 3# x 10 Seated red high and low rows 2x10 ea (try green next time) Seated horizontal ABD red 2 x 10 (try green) Seated green band clams 2x10 Sit to stand from mat table 3# wts in hands 2 sets of 5 Seated thoracic/lumbar extension with heavy purple power cord x 10 RPE (Rating of Perceived Exertion):   3-4  /10 after all of above exercises Seated hamstring stretch 2x30 sec bilat Seated fig 4 2x30 sec B Nustep (blue machine) level 2 x3 min done at end of session today (60 SPM)  11/16/2023: Nustep (blue  machine) level 2 x4 min done at end of session today (60 SPM) Seated 3# core series:   hip to hip, hip to shoulder, Vs, ear to ear 2 x 5 each Seated hip flexion up and over 1/2 foam roller 3# resting on thigh Seated heel raise 2 x5 right/left 3# on thigh Seated hip adduction ball squeeze x10 Seated with 2# ankle weights:  long arc quad, marching, and hip ER.  2x10 each bilat Seated biceps curl to [facility] press 3# x 10 Seated B shoulder flex to 90 deg 3# x 10 Seated yellow red high and low rows x10 ea Seated horizontal ABD red x 10 Seated green band clams 2x10 Sit to stand from mat table with green band around thighs 2 sets of 5 Seated thoracic/lumbar extension with heavy purple power cord x 10 Wall push ups 2x5 RPE (Rating of Perceived Exertion):   3-4  /10 after all of above exercises Seated hamstring stretch 2x30 sec bilat      PATIENT EDUCATION: Education details: Educated patient on anatomy  and physiology of current symptoms, prognosis, plan of care as well as initial self care strategies to promote recovery Person educated: Patient Education method: Explanation Education comprehension: verbalized understanding  HOME EXERCISE PROGRAM: Access Code: [email or web address] URL: [email or web address] Date: 11/04/2023 Prepared by: Jarrell Menke  Exercises - Supine Diaphragmatic Breathing  - 1 x daily - 7 x weekly - 1 sets - 7 reps - Prone Diaphragmatic Breathing  - 1 x daily - 7 x weekly - 1 sets - 7 reps - Seated Diaphragmatic Breathing  - 1 x daily - 7 x weekly - 1 sets - 10 reps - Hooklying Isometric Hip Flexion with Opposite Arm  - 1 x daily - 7 x weekly - 1 sets - 5 reps - Seated Eye and Head Movement Coordination - Side to Side  - 1 x daily - 7 x weekly - 1 sets - 5 reps - Seated Long Arc Quad  - 1 x daily - 7 x weekly - 2 sets - 10 reps - Seated March  - 1 x daily - 7 x weekly - 2 sets - 10 reps - Sit to Stand with Armchair  - 1 x daily - 7 x weekly - 2 sets - 5 reps -  Seated Hamstring Stretch  - 1 x daily - 7 x weekly - 2 reps - 20 sec hold  GOALS: Goals reviewed with patient? Yes  SHORT TERM GOALS: Target date: 12/14/2023   The patient will demonstrate knowledge of basic self care strategies and exercises to promote healing  Baseline: Goal status: MET  2.  The patient will be able to walk for 5 minutes without stopping Baseline:  Goal status: MET 11/28/23  3.  Improved LE strength with patient able to rise from a standard height chair without UE assist with ease Baseline:  Goal status: MET 6/16/      LONG TERM GOALS: Target date: 01/25/2024    The patient will be independent in a safe self progression of a home exercise program to promote further recovery of function   Baseline:  Goal status: IN PROGRESS  2.  PSFS score for reading improved to 5 Baseline:  Goal status:  IN PROGRESS  3.  Patient will be able to  walk her dog PSFS improved to 5 Baseline:  Goal status: IN PROGRESS  4.  Patient will be able to go on an hour long outing for coffee or attend church with PSFS score of 5 Baseline:  Goal status: IN PROGRESS  5.  Lower Extremity Functional Scale improved to   50/80   indicating improved function  Baseline:  Goal status: IN PROGRESS  ASSESSMENT:  CLINICAL IMPRESSION:    Patient was able to progress to more standing exercises today with good tolerance. She maintained the same RPE as in previous visits. She also was able to increase time and SPM on the Nustep at the end of session. She was approved for 4 more visits starting next visit.    OBJECTIVE IMPAIRMENTS: cardiopulmonary status limiting activity, decreased activity tolerance, decreased balance, decreased cognition, decreased endurance, difficulty walking, decreased strength, impaired perceived functional ability, impaired UE functional use, and pain.   ACTIVITY LIMITATIONS: carrying, lifting, bending, sitting, standing, squatting, stairs, transfers, bed mobility,  hygiene/grooming, and locomotion level  PARTICIPATION LIMITATIONS: meal prep, cleaning, laundry, shopping, community activity, and church  PERSONAL FACTORS: Time since onset of injury/illness/exacerbation and 1-2 comorbidities: Diabetes, HTN are also affecting patient's functional outcome.   REHAB POTENTIAL: Good  CLINICAL DECISION MAKING:  Evolving/moderate complexity  EVALUATION COMPLEXITY: Moderate  PLAN:  PT FREQUENCY: 1-2x/week  PT DURATION: 12 weeks  PLANNED INTERVENTIONS: 97164- PT Re-evaluation, 97110-Therapeutic exercises, 97530- Therapeutic activity, 97112- Neuromuscular re-education, 97535- Self Care, [id number]- Manual therapy, [phone]- Aquatic Therapy, Patient/Family education, Balance training, Cryotherapy, and Moist heat  PLAN FOR NEXT SESSION: ;  Assess response to treatment session;  strengthening, flexibility, activity tolerance  Mliss Cummins, PT  12/12/23 5:22 PM [facility] [address] [place], [place] [address] Phone # [phone] Fax [phone]

## 2023-12-12 ENCOUNTER — Ambulatory Visit: Admitting: Physical Therapy

## 2023-12-12 ENCOUNTER — Encounter: Payer: Self-pay | Admitting: Physical Therapy

## 2023-12-12 DIAGNOSIS — M6281 Muscle weakness (generalized): Secondary | ICD-10-CM | POA: Diagnosis not present

## 2023-12-12 DIAGNOSIS — R262 Difficulty in walking, not elsewhere classified: Secondary | ICD-10-CM

## 2023-12-20 NOTE — Therapy (Signed)
 OUTPATIENT PHYSICAL THERAPY TREATMENT NOTE   Patient Name: Christy Torres MRN: [id number] DOB:[birth year], 53 y.o., female Today's Date: 12/21/2023   PCP: Kip Ade NP REFERRING PROVIDER: Dario Daniels FNP  END OF SESSION:  PT End of Session - 12/21/23 1536     Visit Number 8    Date for PT Re-Evaluation 01/25/24    Authorization Type [place] Medicaid [employer]    Authorization Time Period [employer] Approved 4 visits-12/19/2023-01/17/2024-auth#[id number]    Authorization - Visit Number 1    Authorization - Number of Visits 4    PT Start Time 1535    PT Stop Time 1621    PT Time Calculation (min) 46 min    Activity Tolerance Patient tolerated treatment well    Behavior During Therapy WFL for tasks assessed/performed               Past Medical History:  Diagnosis Date   Asthma    Diabetes mellitus without complication (HCC)    Hypercholesteremia    Thyroid disease    Past Surgical History:  Procedure Laterality Date   PILONIDAL CYST DRAINAGE  2010   Patient Active Problem List   Diagnosis Date Noted   Type 2 diabetes mellitus with hyperglycemia, without long-term current use of insulin (HCC) 12/21/2021   Chronic fatigue 12/21/2021   Colon cancer screening 12/21/2021   Thyroid disease 12/21/2021   Family history of thyroid disease in mother 12/21/2021    ONSET DATE: 3 years  REFERRING DIAG: U09.9 Long Covid; Chronic fatigue R53.82, decreased activity tolerance R68.89  THERAPY DIAG:  Weakness; difficulty in walking Rationale for Evaluation and Treatment: Rehabilitation  SUBJECTIVE:                                                                                                                                                                                             SUBJECTIVE STATEMENT: Definitely sore after last visit. Borderline bad. Normal background pain today. Having FCE on 01/07/24 for disability.  PERTINENT HISTORY:  Diabetes mellitus; HTN;  PAD; Covid 3 years ago  PAIN:   Are you having pain? Yes NPRS scale: 4/10 Pain location: general all over body pain: arms and torso Pain orientation: Right and Left  PAIN TYPE: soreness Pain description: constant  Aggravating factors: activity  Relieving factors: lying down, resting    PRECAUTIONS: None     WEIGHT BEARING RESTRICTIONS: No  FALLS: Has patient fallen in last 6 months? 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   COGNITION: Overall cognitive status: Within functional limits for tasks assessed    POSTURE: No Significant postural limitations  LOWER EXTREMITY ROM:   WFLs  STRENGTH:  grossly 4/5 in Ues and Les;  Decreased activation of transverse abdominus muscles; abdominals 4-/5; decreased activation of lumbar multifidi; trunk extensors 4-/5   GAIT: decreased gait speed; no assistive device used   FUNCTIONAL TESTS:  Eval: Able to rise from the chair on 2nd attempt 1x without UE assist but very difficult   11/04/2023: 5 times sit to/from stand:  33.97 sec   Timed Up and Go (TUG):  12.60 sec 3 minute walk test:  497 ft with RPE of 4/10  11/28/23: 5 min walk test: 868 ft RPE 4-5/10  12/05/23  5XSTS 12/05/23  18.63 sec  PATIENT SURVEYS:  LEFS 41/80 eval LEFS  42/80     12/05/23  The Patient-Specific Functional Scale  Initial:  I am going to ask you to identify up to 3 important activities that you are unable to do or are having difficulty with as a result of this problem.  Today are there any activities that you are unable to do or having difficulty with because of this?  (Patient shown scale and patient rated each  activity)  Follow up: When you first came in you had difficulty performing these activities.  Today do you still have difficulty?  Patient-Specific activity scoring scheme (Point to one number):  0 1 2 3 4 5 6 7 8 9  10 Unable                                                                                                          Able to perform To perform                                                                                                    activity at the same Activity         Level as before  Injury or problem  Activity        Be able to read                                                                         Initial:          2               2.       Take my dog for walk                                                                           Initial:          3               3.         Energy to go out to go to a coffee shop or a movie or church            Initial:           2             f/u   12/05/23 3                                                                                                                                                                              TREATMENT DATE:  12/21/2023: Seated 3# core series:   hip to hip, hip to shoulder, Vs, ear to ear 2 x 10 each Seated hip flexion up and over 1/2 hurdle 3# resting on thigh over hurdle x 10 ea  Standing heel raise 2 x 10  Standing marching 2x10, hip ABD 2x10 B Side stepping 10 steps x 2 with green around ankles Functional squat x 10 Seated hip adduction ball squeeze 2x10 Seated biceps curl to [facility] press 3# 2 x 10 Seated B shoulder flex to 90 deg 3# 2 x 10 Sit to stand from mat table 3# wts in hands x 10 Resisted walking 10# x 5 in 4 directions RPE (Rating of Perceived Exertion):   3-4/10 after all of above exercises Nu step (blue machine) level 2 x 6 min  done at end of session  today (75 SPM) - able to complete full lap today    12/12/2023:  Seated 3# core series:   hip to hip, hip to shoulder, Vs, ear to ear 2 x 10 each Seated hip flexion up and over 1/2 hurdle 3# resting on thigh over hurdle x 10 ea (increase weight next) Standing heel raise 2 x 10  Standing marching 2x10, hip ABD 2x10 B Side stepping 10 steps x 4 with green around ankles Functional squat x 10 Seated hip adduction ball squeeze 2x10 Seated biceps curl to [facility] press 3# 2 x 10 Seated B shoulder flex to 90 deg 3# 2 x 10 Sit to stand from mat table 3# wts in hands x 10 Resisted walking 5# x 5 in 4 directions Step up/down 6 inch step x 10 B RPE (Rating of Perceived Exertion):   4-5/10 after all of above exercises Nu step (blue machine) level 2 x 6 min done at end of session today (76 SPM) RPE 4-5/10 at end  12/05/2023:  PSFS and 5XSTS and LEFS completed Seated 3# core series:   hip to hip, hip to shoulder, Vs, ear to ear 2 x 10 each Seated hip flexion up and over 1/2 foam roller 3# resting on thigh x 10 ea, then hurdle x 10 ea (increase weight next time and use hurdle) Seated heel raise 2 x 10 right/left 3# on thigh Seated hip adduction ball squeeze 2x10 Seated with 2# ankle weights:  long arc quad, marching, and hip ER.  1x10 each bilat Seated biceps curl to [facility] press 3# x 10 Seated B shoulder flex to 90 deg 3# x 10 Seated red high and low rows 1x10 ea  Seated horizontal ABD green 1 x 10  Seated green band clams 2x10 Sit to stand from mat table 3# wts in hands x 10 Seated thoracic/lumbar extension with heavy purple power cord x 10 RPE (Rating of Perceived Exertion):   4-5  /10 after all of above exercises Seated fig 4 2x30 sec B Resisted walking 5# x 5 in 4 directions Nustep (blue machine) level 2 x 5 min done at end of session today (60 SPM) RPE 5/10   11/28/2023: 5 min walk test -  868 ft Seated 3# core series:   hip to hip, hip to shoulder, Vs, ear to ear x 10 each Seated hip  flexion up and over 1/2 foam roller 3# resting on thigh x 10 ea Seated heel raise 2 x 10 right/left 3# on thigh Seated hip adduction ball squeeze x10 Seated with 2# ankle weights:  long arc quad, marching, and hip ER.  2x10 each bilat Seated biceps curl to [facility] press 3# x 10 Seated B shoulder flex to 90 deg 3# x 10 Seated red high and low rows 2x10 ea (try green next time) Seated horizontal ABD red 2 x 10 (try green) Seated green band clams 2x10 Sit to stand from mat table 3# wts in hands 2 sets of 5 Seated thoracic/lumbar extension with heavy purple power cord x 10 RPE (Rating of Perceived Exertion):   3-4  /10 after all of above exercises Seated hamstring stretch 2x30 sec bilat Seated fig 4 2x30 sec B Nustep (blue machine) level 2 x3 min done at end of session today (60 SPM)  11/16/2023: Nustep (blue machine) level 2 x4 min done at end of session today (60 SPM) Seated 3# core series:   hip to hip, hip to shoulder, Vs, ear  to ear 2 x 5 each Seated hip flexion up and over 1/2 foam roller 3# resting on thigh Seated heel raise 2 x5 right/left 3# on thigh Seated hip adduction ball squeeze x10 Seated with 2# ankle weights:  long arc quad, marching, and hip ER.  2x10 each bilat Seated biceps curl to [facility] press 3# x 10 Seated B shoulder flex to 90 deg 3# x 10 Seated yellow red high and low rows x10 ea Seated horizontal ABD red x 10 Seated green band clams 2x10 Sit to stand from mat table with green band around thighs 2 sets of 5 Seated thoracic/lumbar extension with heavy purple power cord x 10 Wall push ups 2x5 RPE (Rating of Perceived Exertion):   3-4  /10 after all of above exercises Seated hamstring stretch 2x30 sec bilat      PATIENT EDUCATION: Education details: Educated patient on anatomy and physiology of current symptoms, prognosis, plan of care as well as initial self care strategies to promote recovery Person educated: Patient Education method: Explanation Education  comprehension: verbalized understanding  HOME EXERCISE PROGRAM: Access Code: [email or web address] URL: [email or web address] Date: 11/04/2023 Prepared by: Jarrell Menke  Exercises - Supine Diaphragmatic Breathing  - 1 x daily - 7 x weekly - 1 sets - 7 reps - Prone Diaphragmatic Breathing  - 1 x daily - 7 x weekly - 1 sets - 7 reps - Seated Diaphragmatic Breathing  - 1 x daily - 7 x weekly - 1 sets - 10 reps - Hooklying Isometric Hip Flexion with Opposite Arm  - 1 x daily - 7 x weekly - 1 sets - 5 reps - Seated Eye and Head Movement Coordination - Side to Side  - 1 x daily - 7 x weekly - 1 sets - 5 reps - Seated Long Arc Quad  - 1 x daily - 7 x weekly - 2 sets - 10 reps - Seated March  - 1 x daily - 7 x weekly - 2 sets - 10 reps - Sit to Stand with Armchair  - 1 x daily - 7 x weekly - 2 sets - 5 reps - Seated Hamstring Stretch  - 1 x daily - 7 x weekly - 2 reps - 20 sec hold  GOALS: Goals reviewed with patient? Yes  SHORT TERM GOALS: Target date: 12/14/2023   The patient will demonstrate knowledge of basic self care strategies and exercises to promote healing  Baseline: Goal status: MET  2.  The patient will be able to walk for 5 minutes without stopping Baseline:  Goal status: MET 11/28/23  3.  Improved LE strength with patient able to rise from a standard height chair without UE assist with ease Baseline:  Goal status: MET 6/16/      LONG TERM GOALS: Target date: 01/25/2024    The patient will be independent in a safe self progression of a home exercise program to promote further recovery of function   Baseline:  Goal status: IN PROGRESS  2.  PSFS score for reading improved to 5 Baseline:  Goal status:  IN PROGRESS  3.  Patient will be able to  walk her dog PSFS improved to 5 Baseline:  Goal status: IN PROGRESS  4.  Patient will be able to go on an hour long outing for coffee or attend church with PSFS score of 5 Baseline:  Goal status: IN PROGRESS  5.   Lower Extremity Functional Scale improved to   50/80   indicating improved function  Baseline:  Goal status: IN PROGRESS  ASSESSMENT:  CLINICAL IMPRESSION: Patient reported increased soreness and fatigue after last visit due to increased exercises, but she also feels it was cumulative with all the doctors visits she had to take her mother to as well. We decreased reps on the sidestepping today due to her post visit hip and quad soreness, but she wanted to increase weight with resistive walking to 10# which definitely was more challenging sideways. Patient with decreased RPE at end of session today.    OBJECTIVE IMPAIRMENTS: cardiopulmonary status limiting activity, decreased activity tolerance, decreased balance, decreased cognition, decreased endurance, difficulty walking, decreased strength, impaired perceived functional ability, impaired UE functional use, and pain.   ACTIVITY LIMITATIONS: carrying, lifting, bending, sitting, standing, squatting, stairs, transfers, bed mobility, hygiene/grooming, and locomotion level  PARTICIPATION LIMITATIONS: meal prep, cleaning, laundry, shopping, community activity, and church  PERSONAL FACTORS: Time since onset of injury/illness/exacerbation and 1-2 comorbidities: Diabetes, HTN are also affecting patient's functional outcome.   REHAB POTENTIAL: Good  CLINICAL DECISION MAKING: Evolving/moderate complexity  EVALUATION COMPLEXITY: Moderate  PLAN:  PT FREQUENCY: 1-2x/week  PT DURATION: 12 weeks  PLANNED INTERVENTIONS: 97164- PT Re-evaluation, 97110-Therapeutic exercises, 97530- Therapeutic activity, 97112- Neuromuscular re-education, 97535- Self Care, [id number]- Manual therapy, [phone]- Aquatic Therapy, Patient/Family education, Balance training, Cryotherapy, and Moist heat  PLAN FOR NEXT SESSION: ;  Assess response to treatment session;  strengthening, flexibility, activity tolerance  Mliss Cummins, PT  12/21/23 4:30 PM [facility]  Services [address], [address] [place], [place] [address] Phone # [phone] Fax [phone]

## 2023-12-21 ENCOUNTER — Ambulatory Visit: Attending: Family Medicine | Admitting: Physical Therapy

## 2023-12-21 ENCOUNTER — Encounter: Payer: Self-pay | Admitting: Physical Therapy

## 2023-12-21 DIAGNOSIS — R262 Difficulty in walking, not elsewhere classified: Secondary | ICD-10-CM | POA: Insufficient documentation

## 2023-12-21 DIAGNOSIS — M6281 Muscle weakness (generalized): Secondary | ICD-10-CM | POA: Diagnosis present

## 2023-12-28 ENCOUNTER — Ambulatory Visit: Admitting: Physical Therapy

## 2023-12-28 DIAGNOSIS — R262 Difficulty in walking, not elsewhere classified: Secondary | ICD-10-CM

## 2023-12-28 DIAGNOSIS — M6281 Muscle weakness (generalized): Secondary | ICD-10-CM

## 2023-12-28 NOTE — Therapy (Signed)
 OUTPATIENT PHYSICAL THERAPY TREATMENT NOTE   Patient Name: Christy Torres MRN: [id number] DOB:05/14/1971, 53 y.o., female Today's Date: 12/28/2023   PCP: Kip Ade NP REFERRING PROVIDER: Dario Daniels FNP  END OF SESSION:  PT End of Session - 12/28/23 1535     Visit Number 9    Date for PT Re-Evaluation 01/25/24    Authorization Type [place] [employer]    Authorization Time Period [employer] Approved 4 visits-12/19/2023-01/17/2024-auth#[id number]    Authorization - Visit Number 2    Authorization - Number of Visits 4    PT Start Time 1533    PT Stop Time 1615    PT Time Calculation (min) 42 min    Activity Tolerance Patient tolerated treatment well               Past Medical History:  Diagnosis Date   Asthma    Diabetes mellitus without complication (HCC)    Hypercholesteremia    Thyroid disease    Past Surgical History:  Procedure Laterality Date   PILONIDAL CYST DRAINAGE  2010   Patient Active Problem List   Diagnosis Date Noted   Type 2 diabetes mellitus with hyperglycemia, without long-term current use of insulin (HCC) 12/21/2021   Chronic fatigue 12/21/2021   Colon cancer screening 12/21/2021   Thyroid disease 12/21/2021   Family history of thyroid disease in mother 12/21/2021    ONSET DATE: 3 years  REFERRING DIAG: U09.9 Long Covid; Chronic fatigue R53.82, decreased activity tolerance R68.89  THERAPY DIAG:  Weakness; difficulty in walking Rationale for Evaluation and Treatment: Rehabilitation  SUBJECTIVE:                                                                                                                                                                                             SUBJECTIVE STATEMENT: Mother newly diagnosed with colorectal cancer.  Been going to all her appts with her which is cummulative in fatigue in soreness.  After last session was sore but it was tolerable.  Like a 5.   Having FCE on 01/07/24 for  disability.  PERTINENT HISTORY:  Diabetes mellitus; HTN; PAD; Covid 3 years ago  PAIN:   Are you having pain? Yes NPRS scale: 5/10 Pain location: general all over body pain: arms and torso Pain orientation: Right and Left  PAIN TYPE: soreness Pain description: constant  Aggravating factors: activity  Relieving factors: lying down, resting    PRECAUTIONS: None     WEIGHT BEARING RESTRICTIONS: No  FALLS: Has patient fallen in last 6 months? 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   COGNITION: Overall cognitive status: Within functional limits for tasks assessed    POSTURE: No Significant postural limitations  LOWER EXTREMITY ROM:   WFLs  STRENGTH:  grossly 4/5 in Ues and Les;  Decreased activation of transverse abdominus muscles; abdominals 4-/5; decreased activation of lumbar multifidi; trunk extensors 4-/5   GAIT: decreased gait speed; no assistive device used   FUNCTIONAL TESTS:  Eval: Able to rise from the chair on 2nd attempt 1x without UE assist but very difficult   11/04/2023: 5 times sit to/from stand:  33.97 sec   Timed Up and Go (TUG):  12.60 sec 3 minute walk test:  497 ft with RPE of 4/10  11/28/23: 5 min walk test: 868 ft RPE 4-5/10  12/05/23  5XSTS 12/05/23  18.63 sec  PATIENT SURVEYS:  LEFS 41/80 eval LEFS  42/80     12/05/23  The Patient-Specific Functional Scale  Initial:  I am going to ask you to identify up to 3 important activities that you are unable to do or are having difficulty with as a result of this problem.  Today are there any activities that you are unable to do or having difficulty with because of this?   (Patient shown scale and patient rated each activity)  Follow up: When you first came in you had difficulty performing these activities.  Today do you still have difficulty?  Patient-Specific activity scoring scheme (Point to one number):  0 1 2 3 4 5 6 7 8 9  10 Unable                                                                                                          Able to perform To perform                                                                                                    activity at the same Activity         Level as before  Injury or problem  Activity        Be able to read                                                                         Initial:          2               2.       Take my dog for walk                                                                           Initial:          3               3.         Energy to go out to go to a coffee shop or a movie or church            Initial:           2             f/u   12/05/23 3                                                                                                                                                                              TREATMENT DATE:  12/27/2023: Seated 4# core series:   hip to hip, hip to shoulder, Vs, ear to ear  x 10 each Seated hip flexion up and over 4 inch high target 4# resting on thigh over hurdle x 10 ea  2 rounds: 1 min rest between rounds Sit to stand holding 4# weight 3x Standing hip abduction 5x  Standing hip extension 5x Standing overhead press 4# 5x each arm 2 rounds: 1 min rest between rounds Standing heel raise  x 10  Standing marching with arm swings x10, Standing bicep curls 4# 10x Back step partial lunge 5x each side holding to the counter pain still 5/10 and fatigue level the same as on arrival  Nu step (blue machine) level  2 x 6 min done at end of session today (80-85  SPM) - able to complete full lap  + extra today  12/21/2023: Seated 3# core series:   hip to hip, hip to shoulder, Vs, ear to ear 2 x 10 each Seated hip flexion up and over 1/2 hurdle 3# resting on thigh over hurdle x 10 ea  Standing heel raise 2 x 10  Standing marching 2x10, hip ABD 2x10 B Side stepping 10 steps x 2 with green around ankles Functional squat x 10 Seated hip adduction ball squeeze 2x10 Seated biceps curl to [facility] press 3# 2 x 10 Seated B shoulder flex to 90 deg 3# 2 x 10 Sit to stand from mat table 3# wts in hands x 10 Resisted walking 10# x 5 in 4 directions RPE (Rating of Perceived Exertion):   3-4/10 after all of above exercises Nu step (blue machine) level 2 x 6 min done at end of session today (75 SPM) - able to complete full lap today    12/12/2023:  Seated 3# core series:   hip to hip, hip to shoulder, Vs, ear to ear 2 x 10 each Seated hip flexion up and over 1/2 hurdle 3# resting on thigh over hurdle x 10 ea (increase weight next) Standing heel raise 2 x 10  Standing marching 2x10, hip ABD 2x10 B Side stepping 10 steps x 4 with green around ankles Functional squat x 10 Seated hip adduction ball squeeze 2x10 Seated biceps curl to [facility] press 3# 2 x 10 Seated B shoulder flex to 90 deg 3# 2 x 10 Sit to stand from mat table 3# wts in hands x 10 Resisted walking 5# x 5 in 4 directions Step up/down 6 inch step x 10 B RPE (Rating of Perceived Exertion):   4-5/10 after all of above exercises Nu step (blue machine) level 2 x 6 min done at end of session today (76 SPM) RPE 4-5/10 at end  12/05/2023:  PSFS and 5XSTS and LEFS completed Seated 3# core series:   hip to hip, hip to shoulder, Vs, ear to ear 2 x 10 each Seated hip flexion up and over 1/2 foam roller 3# resting on thigh x 10 ea, then hurdle x 10 ea (increase weight next time and use hurdle) Seated heel raise 2 x 10 right/left 3# on thigh Seated hip adduction ball  squeeze 2x10 Seated with 2# ankle weights:  long arc quad, marching, and hip ER.  1x10 each bilat Seated biceps curl to [facility] press 3# x 10 Seated B shoulder flex to 90 deg 3# x 10 Seated red high and low rows 1x10 ea  Seated horizontal ABD green 1 x 10  Seated green band clams 2x10 Sit to stand from mat table 3# wts in hands x 10 Seated thoracic/lumbar extension with heavy purple power cord x 10 RPE (Rating of Perceived Exertion):   4-5  /10 after all of above exercises Seated fig 4 2x30 sec B Resisted walking 5# x 5 in 4 directions Nustep (blue machine) level 2 x 5 min done at end of session today (60 SPM) RPE 5/10   11/28/2023: 5 min walk test -  868 ft Seated 3# core series:   hip to hip, hip to shoulder, Vs, ear to ear x 10 each Seated hip flexion up and over 1/2 foam roller 3# resting on thigh x 10 ea Seated heel raise 2 x 10 right/left 3# on thigh Seated hip adduction ball squeeze x10 Seated with 2# ankle weights:  long arc quad, marching, and hip  ER.  2x10 each bilat Seated biceps curl to [facility] press 3# x 10 Seated B shoulder flex to 90 deg 3# x 10 Seated red high and low rows 2x10 ea (try green next time) Seated horizontal ABD red 2 x 10 (try green) Seated green band clams 2x10 Sit to stand from mat table 3# wts in hands 2 sets of 5 Seated thoracic/lumbar extension with heavy purple power cord x 10 RPE (Rating of Perceived Exertion):   3-4  /10 after all of above exercises Seated hamstring stretch 2x30 sec bilat Seated fig 4 2x30 sec B Nustep (blue machine) level 2 x3 min done at end of session today (60 SPM)      PATIENT EDUCATION: Education details: Educated patient on anatomy and physiology of current symptoms, prognosis, plan of care as well as initial self care strategies to promote recovery Person educated: Patient Education method: Explanation Education comprehension: verbalized understanding  HOME EXERCISE PROGRAM: Access Code: [email or web address] URL:  [email or web address] Date: 11/04/2023 Prepared by: Jarrell Menke  Exercises - Supine Diaphragmatic Breathing  - 1 x daily - 7 x weekly - 1 sets - 7 reps - Prone Diaphragmatic Breathing  - 1 x daily - 7 x weekly - 1 sets - 7 reps - Seated Diaphragmatic Breathing  - 1 x daily - 7 x weekly - 1 sets - 10 reps - Hooklying Isometric Hip Flexion with Opposite Arm  - 1 x daily - 7 x weekly - 1 sets - 5 reps - Seated Eye and Head Movement Coordination - Side to Side  - 1 x daily - 7 x weekly - 1 sets - 5 reps - Seated Long Arc Quad  - 1 x daily - 7 x weekly - 2 sets - 10 reps - Seated March  - 1 x daily - 7 x weekly - 2 sets - 10 reps - Sit to Stand with Armchair  - 1 x daily - 7 x weekly - 2 sets - 5 reps - Seated Hamstring Stretch  - 1 x daily - 7 x weekly - 2 reps - 20 sec hold  GOALS: Goals reviewed with patient? Yes  SHORT TERM GOALS: Target date: 12/14/2023   The patient will demonstrate knowledge of basic self care strategies and exercises to promote healing  Baseline: Goal status: MET  2.  The patient will be able to walk for 5 minutes without stopping Baseline:  Goal status: MET 11/28/23  3.  Improved LE strength with patient able to rise from a standard height chair without UE assist with ease Baseline:  Goal status: MET 6/16/      LONG TERM GOALS: Target date: 01/25/2024    The patient will be independent in a safe self progression of a home exercise program to promote further recovery of function   Baseline:  Goal status: IN PROGRESS  2.  PSFS score for reading improved to 5 Baseline:  Goal status:  IN PROGRESS  3.  Patient will be able to  walk her dog PSFS improved to 5 Baseline:  Goal status: IN PROGRESS  4.  Patient will be able to go on an hour long outing for coffee or attend church with PSFS score of 5 Baseline:  Goal status: IN PROGRESS  5.  Lower Extremity Functional Scale improved to   50/80   indicating improved function  Baseline:   Goal status: IN PROGRESS  ASSESSMENT:  CLINICAL IMPRESSION: Lachandra has made steady improvements in stamina, strength and conditioning since start of  care.  She is now able to exercise under supervision for 40 minutes with short rest breaks between circuit rounds for recovery.  Therapist very closely monitoring pain and fatigue level to avoid overexertion related to Long Covid and Chronic Fatigue Syndrome.  She has cummulative fatigue on arrival due to her mother's new cancer diagnosis and attending appts with her.      OBJECTIVE IMPAIRMENTS: cardiopulmonary status limiting activity, decreased activity tolerance, decreased balance, decreased cognition, decreased endurance, difficulty walking, decreased strength, impaired perceived functional ability, impaired UE functional use, and pain.   ACTIVITY LIMITATIONS: carrying, lifting, bending, sitting, standing, squatting, stairs, transfers, bed mobility, hygiene/grooming, and locomotion level  PARTICIPATION LIMITATIONS: meal prep, cleaning, laundry, shopping, community activity, and church  PERSONAL FACTORS: Time since onset of injury/illness/exacerbation and 1-2 comorbidities: Diabetes, HTN are also affecting patient's functional outcome.   REHAB POTENTIAL: Good  CLINICAL DECISION MAKING: Evolving/moderate complexity  EVALUATION COMPLEXITY: Moderate  PLAN:  PT FREQUENCY: 1-2x/week  PT DURATION: 12 weeks  PLANNED INTERVENTIONS: 97164- PT Re-evaluation, 97110-Therapeutic exercises, 97530- Therapeutic activity, 97112- Neuromuscular re-education, 97535- Self Care, [id number]- Manual therapy, [phone]- Aquatic Therapy, Patient/Family education, Balance training, Cryotherapy, and Moist heat  PLAN FOR NEXT SESSION: submit for additional visit request;   Assess response to treatment session;  strengthening, flexibility, activity tolerance  Glade Pesa, PT 12/28/23 4:27 PM Phone: [phone] Fax: [phone] [facility] [address], [address] [place], [place] [address] Phone # [phone] Fax [phone]

## 2024-01-04 ENCOUNTER — Ambulatory Visit: Admitting: Physical Therapy

## 2024-01-04 DIAGNOSIS — M6281 Muscle weakness (generalized): Secondary | ICD-10-CM

## 2024-01-04 DIAGNOSIS — R262 Difficulty in walking, not elsewhere classified: Secondary | ICD-10-CM

## 2024-01-04 NOTE — Therapy (Signed)
 OUTPATIENT PHYSICAL THERAPY TREATMENT NOTE   Patient Name: Christy Torres MRN: [id number] DOB:1970/12/04, 53 y.o., female Today's Date: 01/04/2024   PCP: Kip Ade NP REFERRING PROVIDER: Dario Daniels FNP  END OF SESSION:  PT End of Session - 01/04/24 1444     Visit Number 10    Date for PT Re-Evaluation 01/25/24    Authorization Type [place] Medicaid [employer]    Authorization Time Period [employer] Approved 4 visits-12/19/2023-01/17/2024-auth#[id number]    Authorization - Visit Number 3    Authorization - Number of Visits 4    PT Start Time 1445    PT Stop Time 1529    PT Time Calculation (min) 44 min               Past Medical History:  Diagnosis Date   Asthma    Diabetes mellitus without complication (HCC)    Hypercholesteremia    Thyroid disease    Past Surgical History:  Procedure Laterality Date   PILONIDAL CYST DRAINAGE  2010   Patient Active Problem List   Diagnosis Date Noted   Type 2 diabetes mellitus with hyperglycemia, without long-term current use of insulin (HCC) 12/21/2021   Chronic fatigue 12/21/2021   Colon cancer screening 12/21/2021   Thyroid disease 12/21/2021   Family history of thyroid disease in mother 12/21/2021    ONSET DATE: 3 years  REFERRING DIAG: U09.9 Long Covid; Chronic fatigue R53.82, decreased activity tolerance R68.89  THERAPY DIAG:  Weakness; difficulty in walking Rationale for Evaluation and Treatment: Rehabilitation  SUBJECTIVE:                                                                                                                                                                                             SUBJECTIVE STATEMENT: Last time was just the right amount.  Really exhausted with mother's surgery.      Mother newly diagnosed with colorectal cancer.  Been going to all her appts with her which is cummulative in fatigue in soreness.  After last session was sore but it was tolerable.  Like a 5.    Having FCE on 01/07/24 for disability.  PERTINENT HISTORY:  Diabetes mellitus; HTN; PAD; Covid 3 years ago  PAIN:   Are you having pain? Yes NPRS scale: 5/10 Pain location: general all over body pain: arms and torso Pain orientation: Right and Left  PAIN TYPE: soreness Pain description: constant  Aggravating factors: activity  Relieving factors: lying down, resting    PRECAUTIONS: None     WEIGHT BEARING RESTRICTIONS: No  FALLS: Has patient fallen in last 6 months? 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   COGNITION: Overall cognitive status: Within functional limits for tasks assessed    POSTURE: No Significant postural limitations  LOWER EXTREMITY ROM:   WFLs  STRENGTH:  grossly 4/5 in Ues and Les;  Decreased activation of transverse abdominus muscles; abdominals 4-/5; decreased activation of lumbar multifidi; trunk extensors 4-/5   GAIT: decreased gait speed; no assistive device used   FUNCTIONAL TESTS:  Eval: Able to rise from the chair on 2nd attempt 1x without UE assist but very difficult   11/04/2023: 5 times sit to/from stand:  33.97 sec   Timed Up and Go (TUG):  12.60 sec 3 minute walk test:  497 ft with RPE of 4/10  11/28/23: 5 min walk test: 868 ft RPE 4-5/10  12/05/23  5XSTS 12/05/23  18.63 sec  PATIENT SURVEYS:  LEFS 41/80 eval LEFS  42/80     12/05/23  The Patient-Specific Functional Scale  Initial:  I am going to ask you to identify up to 3 important activities that you are unable to do or are having difficulty with as a result of this problem.  Today are there any activities that you are unable to do or having  difficulty with because of this?  (Patient shown scale and patient rated each activity)  Follow up: When you first came in you had difficulty performing these activities.  Today do you still have difficulty?  Patient-Specific activity scoring scheme (Point to one number):  0 1 2 3 4 5 6 7 8 9  10 Unable                                                                                                          Able to perform To perform                                                                                                    activity at the same Activity         Level as before  Injury or problem  Activity        Be able to read                                                                         Initial:          2               2.       Take my dog for walk                                                                           Initial:          3               3.         Energy to go out to go to a coffee shop or a movie or church            Initial:           2             f/u   12/05/23 3                                                                                                                                                                              TREATMENT DATE:  01/04/2024: Seated 5# core series:   hip to hip, hip to shoulder, Vs, ear to ear  x 10 each 2 rounds: 2 min rest between rounds Sit to stand holding 5# weight 3x Standing hip abduction 5x  Standing hip extension 5x Seated hip flexion up and over 4 inch high target 5# resting on thigh over hurdle Seated 5#  overhead press 5x 2 rounds: 2 min rest between rounds Standing heel raise  x 10  Standing marching with arm swings x10, Standing 5# kettlebell pass around the waist 10x Standing bicep curls 5# 10x Back step partial lunge 10x each side holding to // bars RPE 6/10 Nu step ( green  machine) level 5 x 6 min done at end of session today (76 SPM) - able to complete full  lap   12/27/2023: Seated 4# core series:   hip to hip, hip to shoulder, Vs, ear to ear  x 10 each Seated hip flexion up and over 4 inch high target 4# resting on thigh over hurdle x 10 ea  2 rounds: 1 min rest between rounds Sit to stand holding 4# weight 3x Standing hip abduction 5x  Standing hip extension 5x Standing overhead press 4# 5x each arm 2 rounds: 1 min rest between rounds Standing heel raise  x 10  Standing marching with arm swings x10, Standing bicep curls 4# 10x Back step partial lunge 5x each side holding to the counter pain still 5/10 and fatigue level the same as on arrival  Nu step (blue machine) level 2 x 6 min done at end of session today (80-85 SPM) - able to complete full lap  + extra today  12/21/2023: Seated 3# core series:   hip to hip, hip to shoulder, Vs, ear to ear 2 x 10 each Seated hip flexion up and over 1/2 hurdle 3# resting on thigh over hurdle x 10 ea  Standing heel raise 2 x 10  Standing marching 2x10, hip ABD 2x10 B Side stepping 10 steps x 2 with green around ankles Functional squat x 10 Seated hip adduction ball squeeze 2x10 Seated biceps curl to [facility] press 3# 2 x 10 Seated B shoulder flex to 90 deg 3# 2 x 10 Sit to stand from mat table 3# wts in hands x 10 Resisted walking 10# x 5 in 4 directions RPE (Rating of Perceived Exertion):   3-4/10 after all of above exercises Nu step (blue machine) level 2 x 6 min done at end of session today (75 SPM) - able to complete full lap today    12/12/2023:  Seated 3# core series:   hip to hip, hip to shoulder, Vs, ear to ear 2 x 10 each Seated hip flexion up and over 1/2 hurdle 3# resting on thigh over hurdle x 10 ea (increase weight next) Standing heel raise 2 x 10  Standing marching 2x10, hip ABD 2x10 B Side stepping 10 steps x 4 with green around ankles Functional squat x 10 Seated hip adduction ball squeeze  2x10 Seated biceps curl to [place] press 3# 2 x 10 Seated B shoulder flex to 90 deg 3# 2 x 10 Sit to stand from mat table 3# wts in hands x 10 Resisted walking 5# x 5 in 4 directions Step up/down 6 inch step x 10 B RPE (Rating of Perceived Exertion):   4-5/10 after all of above exercises Nu step (blue machine) level 2 x 6 min done at end of session today (76 SPM) RPE 4-5/10 at end   PATIENT EDUCATION: Education details: Educated patient on anatomy and physiology of current symptoms, prognosis, plan of care as well as initial self care strategies to promote recovery Person educated: Patient Education method: Explanation Education comprehension: verbalized understanding  HOME EXERCISE PROGRAM: Access Code: [email or web address] URL: [email or web address] Date: 11/04/2023 Prepared by: Jarrell Menke  Exercises - Supine Diaphragmatic Breathing  - 1 x daily - 7 x weekly - 1 sets - 7 reps - Prone Diaphragmatic Breathing  - 1 x daily - 7 x weekly - 1 sets - 7 reps - Seated Diaphragmatic Breathing  - 1 x daily - 7 x weekly - 1 sets - 10 reps - Hooklying Isometric Hip Flexion with Opposite Arm  - 1 x daily - 7 x weekly - 1 sets - 5 reps -  Seated Eye and Head Movement Coordination - Side to Side  - 1 x daily - 7 x weekly - 1 sets - 5 reps - Seated Long Arc Quad  - 1 x daily - 7 x weekly - 2 sets - 10 reps - Seated March  - 1 x daily - 7 x weekly - 2 sets - 10 reps - Sit to Stand with Armchair  - 1 x daily - 7 x weekly - 2 sets - 5 reps - Seated Hamstring Stretch  - 1 x daily - 7 x weekly - 2 reps - 20 sec hold  GOALS: Goals reviewed with patient? Yes  SHORT TERM GOALS: Target date: 12/14/2023   The patient will demonstrate knowledge of basic self care strategies and exercises to promote healing  Baseline: Goal status: MET  2.  The patient will be able to walk for 5 minutes without stopping Baseline:  Goal status: MET 11/28/23  3.  Improved LE strength with patient able to rise from a  standard height chair without UE assist with ease Baseline:  Goal status: MET 6/16/      LONG TERM GOALS: Target date: 01/25/2024    The patient will be independent in a safe self progression of a home exercise program to promote further recovery of function   Baseline:  Goal status: IN PROGRESS  2.  PSFS score for reading improved to 5 Baseline:  Goal status:  IN PROGRESS  3.  Patient will be able to  walk her dog PSFS improved to 5 Baseline:  Goal status: IN PROGRESS  4.  Patient will be able to go on an hour long outing for coffee or attend church with PSFS score of 5 Baseline:  Goal status: IN PROGRESS  5.  Lower Extremity Functional Scale improved to   50/80   indicating improved function  Baseline:  Goal status: IN PROGRESS  ASSESSMENT:  CLINICAL IMPRESSION: Improving with exercise capacity with gradual increases in intensity (increased resistance and number of reps).  Therapist closely monitoring fatigue and pain level and modifying treatment accordingly in order to avoid exacerbation of symptoms.  She reports extra fatigue this week after being with her mom at the hospital.  Will check progress toward goals and functional outcome measures next visit.        OBJECTIVE IMPAIRMENTS: cardiopulmonary status limiting activity, decreased activity tolerance, decreased balance, decreased cognition, decreased endurance, difficulty walking, decreased strength, impaired perceived functional ability, impaired UE functional use, and pain.   ACTIVITY LIMITATIONS: carrying, lifting, bending, sitting, standing, squatting, stairs, transfers, bed mobility, hygiene/grooming, and locomotion level  PARTICIPATION LIMITATIONS: meal prep, cleaning, laundry, shopping, community activity, and church  PERSONAL FACTORS: Time since onset of injury/illness/exacerbation and 1-2 comorbidities: Diabetes, HTN are also affecting patient's functional outcome.   REHAB POTENTIAL: Good  CLINICAL  DECISION MAKING: Evolving/moderate complexity  EVALUATION COMPLEXITY: Moderate  PLAN:  PT FREQUENCY: 1-2x/week  PT DURATION: 12 weeks  PLANNED INTERVENTIONS: 97164- PT Re-evaluation, 97110-Therapeutic exercises, 97530- Therapeutic activity, 97112- Neuromuscular re-education, 97535- Self Care, [id number]- Manual therapy, [phone]- Aquatic Therapy, Patient/Family education, Balance training, Cryotherapy, and Moist heat  PLAN FOR NEXT SESSION: submit for additional visit request;  PSFS; LEFS; Assess response to treatment session;  strengthening, flexibility, activity tolerance  Glade Pesa, PT 01/04/24 3:34 PM Phone: [phone] Fax: [phone]  [facility] [address], [address] [place], [place] [address] Phone # [phone] Fax [phone]

## 2024-01-12 ENCOUNTER — Ambulatory Visit: Admitting: Physical Therapy

## 2024-01-12 DIAGNOSIS — M6281 Muscle weakness (generalized): Secondary | ICD-10-CM | POA: Diagnosis not present

## 2024-01-12 DIAGNOSIS — R262 Difficulty in walking, not elsewhere classified: Secondary | ICD-10-CM

## 2024-01-12 NOTE — Therapy (Addendum)
 OUTPATIENT PHYSICAL THERAPY TREATMENT NOTE/RECERTIFICATION   Patient Name: Christy Torres MRN: [id number] DOB:06-19-1970, 53 y.o., female Today's Date: 01/12/2024   PCP: Kip Ade NP REFERRING PROVIDER: Dario Daniels FNP  END OF SESSION:  PT End of Session - 01/12/24 1535     Visit Number 11    Date for PT Re-Evaluation 03/08/24    Authorization Type [place] Medicaid Healthy [employer]    Authorization Time Period [employer] Approved 4 visits-12/19/2023-01/17/2024-auth#[id number]    Authorization - Visit Number 4    Authorization - Number of Visits 4    PT Start Time 1532    PT Stop Time 1613    PT Time Calculation (min) 41 min    Activity Tolerance Patient tolerated treatment well               Past Medical History:  Diagnosis Date   Asthma    Diabetes mellitus without complication (HCC)    Hypercholesteremia    Thyroid disease    Past Surgical History:  Procedure Laterality Date   PILONIDAL CYST DRAINAGE  2010   Patient Active Problem List   Diagnosis Date Noted   Type 2 diabetes mellitus with hyperglycemia, without long-term current use of insulin (HCC) 12/21/2021   Chronic fatigue 12/21/2021   Colon cancer screening 12/21/2021   Thyroid disease 12/21/2021   Family history of thyroid disease in mother 12/21/2021    ONSET DATE: 3 years  REFERRING DIAG: U09.9 Long Covid; Chronic fatigue R53.82, decreased activity tolerance R68.89  THERAPY DIAG:  Weakness; difficulty in walking Rationale for Evaluation and Treatment: Rehabilitation  SUBJECTIVE:                                                                                                                                                                                             SUBJECTIVE STATEMENT: Had FCE, unsure of the results.  Reports improvements since starting PT.     Mother newly diagnosed with colorectal cancer.  Been going to all her appts with her which is cummulative in fatigue in  soreness.  After last session was sore but it was tolerable.  Like a 5.   Having FCE on 01/07/24 for disability.  PERTINENT HISTORY:  Diabetes mellitus; HTN; PAD; Covid 3 years ago  PAIN:   Are you having pain? Yes NPRS scale: 4/10 Pain location: general all over body pain: arms and torso Pain orientation: Right and Left  PAIN TYPE: soreness Pain description: constant  Aggravating factors: activity  Relieving factors: lying down, resting    PRECAUTIONS: None     WEIGHT BEARING RESTRICTIONS: No  FALLS: Has patient fallen in last 6  months? 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   COGNITION: Overall cognitive status: Within functional limits for tasks assessed    POSTURE: No Significant postural limitations  LOWER EXTREMITY ROM:   WFLs  STRENGTH:  grossly 4/5 in Ues and Les;  Decreased activation of transverse abdominus muscles; abdominals 4-/5; decreased activation of lumbar multifidi; trunk extensors 4-/5   GAIT: decreased gait speed; no assistive device used   FUNCTIONAL TESTS:  Eval: Able to rise from the chair on 2nd attempt 1x without UE assist but very difficult   11/04/2023: 5 times sit to/from stand:  33.97 sec   Timed Up and Go (TUG):  12.60 sec 3 minute walk test:  497 ft with RPE of 4/10  11/28/23: 5 min walk test: 868 ft RPE 4-5/10  12/05/23  5XSTS 12/05/23  18.63 sec  7/31: 5 min walk test 945 feet RPE 4/10          5x STS:  19.01  PATIENT SURVEYS:  LEFS 41/80 eval LEFS  42/80     12/05/23 7/31: 42/80    The Patient-Specific Functional Scale  Initial:  I am going to ask you to identify up to 3 important activities  that you are unable to do or are having difficulty with as a result of this problem.  Today are there any activities that you are unable to do or having difficulty with because of this?  (Patient shown scale and patient rated each activity)  Follow up: When you first came in you had difficulty performing these activities.  Today do you still have difficulty?  Patient-Specific activity scoring scheme (Point to one number):  0 1 2 3 4 5 6 7 8 9  10 Unable                                                                                                          Able to perform To perform                                                                                                    activity at the same Activity         Level as before  Injury or problem  Activity        Be able to read                                                                         Initial:          2             7/31: 4  2.       Take my dog for walk                                                                           Initial:          3          7/31: 4     3.         Energy to go out to go to a coffee shop or a movie or church            Initial:           2       7/31: 2                                                                                                                                                                              TREATMENT DATE:  01/12/2024: Nu step ( green machine) level 5 x 6 min done at end of session today (76 SPM) - able to complete full lap LEFS 5 min walk test PSFS Standing  5# core series:    hip to shoulder, Vs, ear to ear  x 10 each Seated 5#  overhead press 5x Standing 5# kettlebell pass around the waist 10x Standing bicep curls 5# 10x Standing marching with arm swings x10   01/04/2024: Seated 5# core series:   hip to hip, hip to shoulder, Vs, ear  to ear  x 10 each 2 rounds: 2 min rest between rounds Sit to stand holding 5# weight 3x Standing hip abduction 5x  Standing hip extension 5x Seated hip flexion up and over 4 inch high target 5# resting on thigh over hurdle Seated 5#  overhead press  5x 2 rounds: 2 min rest between rounds Standing heel raise  x 10  Standing marching with arm swings x10, Standing 5# kettlebell pass around the waist 10x Standing bicep curls 5# 10x Back step partial lunge 10x each side holding to // bars RPE 6/10 Nu step ( green machine) level 5 x 6 min done at end of session today (76 SPM) - able to complete full lap   12/27/2023: Seated 4# core series:   hip to hip, hip to shoulder, Vs, ear to ear  x 10 each Seated hip flexion up and over 4 inch high target 4# resting on thigh over hurdle x 10 ea  2 rounds: 1 min rest between rounds Sit to stand holding 4# weight 3x Standing hip abduction 5x  Standing hip extension 5x Standing overhead press 4# 5x each arm 2 rounds: 1 min rest between rounds Standing heel raise  x 10  Standing marching with arm swings x10, Standing bicep curls 4# 10x Back step partial lunge 5x each side holding to the counter pain still 5/10 and fatigue level the same as on arrival  Nu step (blue machine) level 2 x 6 min done at end of session today (80-85 SPM) - able to complete full lap  + extra today  12/21/2023: Seated 3# core series:   hip to hip, hip to shoulder, Vs, ear to ear 2 x 10 each Seated hip flexion up and over 1/2 hurdle 3# resting on thigh over hurdle x 10 ea  Standing heel raise 2 x 10  Standing marching 2x10, hip ABD 2x10 B Side stepping 10 steps x 2 with green around ankles Functional squat x 10 Seated hip adduction ball squeeze 2x10 Seated biceps curl to [facility] press 3# 2 x 10 Seated B shoulder flex to 90 deg 3# 2 x 10 Sit to stand from mat table 3# wts in hands x 10 Resisted walking 10# x 5 in 4 directions RPE (Rating of Perceived Exertion):   3-4/10 after all  of above exercises Nu step (blue machine) level 2 x 6 min done at end of session today (75 SPM) - able to complete full lap today  PATIENT EDUCATION: Education details: Educated patient on anatomy and physiology of current symptoms, prognosis, plan of care as well as initial self care strategies to promote recovery Person educated: Patient Education method: Explanation Education comprehension: verbalized understanding  HOME EXERCISE PROGRAM: Access Code: [email or web address] URL: [email or web address] Date: 11/04/2023 Prepared by: Jarrell Menke  Exercises - Supine Diaphragmatic Breathing  - 1 x daily - 7 x weekly - 1 sets - 7 reps - Prone Diaphragmatic Breathing  - 1 x daily - 7 x weekly - 1 sets - 7 reps - Seated Diaphragmatic Breathing  - 1 x daily - 7 x weekly - 1 sets - 10 reps - Hooklying Isometric Hip Flexion with Opposite Arm  - 1 x daily - 7 x weekly - 1 sets - 5 reps - Seated Eye and Head Movement Coordination - Side to Side  - 1 x daily - 7 x weekly - 1 sets - 5 reps - Seated Long Arc Quad  - 1 x daily - 7 x weekly - 2 sets - 10 reps - Seated March  - 1 x daily - 7 x weekly - 2 sets - 10 reps - Sit to Stand with Armchair  - 1 x daily - 7 x weekly - 2 sets - 5 reps - Seated Hamstring Stretch  - 1 x  daily - 7 x weekly - 2 reps - 20 sec hold  GOALS: Goals reviewed with patient? Yes  SHORT TERM GOALS: Target date: 12/14/2023   The patient will demonstrate knowledge of basic self care strategies and exercises to promote healing  Baseline: Goal status: MET  2.  The patient will be able to walk for 5 minutes without stopping Baseline:  Goal status: MET 11/28/23  3.  Improved LE strength with patient able to rise from a standard height chair without UE assist with ease Baseline:  Goal status: MET 6/16/      LONG TERM GOALS: Target date: 03/08/2024    The patient will be independent in a safe self progression of a home exercise program to promote further recovery of  function   Baseline:  Goal status: IN PROGRESS  2.  PSFS score for reading improved to 5 Baseline:  Goal status:  IN PROGRESS  3.  Patient will be able to  walk her dog PSFS improved to 5 Baseline:  Goal status: IN PROGRESS  4.  Patient will be able to go on an hour long outing for coffee or attend church with PSFS score of 5 Baseline:  Goal status: IN PROGRESS  5.  Lower Extremity Functional Scale improved to   50/80   indicating improved function  Baseline:  Goal status: IN PROGRESS  ASSESSMENT:  CLINICAL IMPRESSION: Amarya has made improvements in 2 out of 3 functional impairments per PSFS as above.  Her  5 min walk test and 5x STS tests have also improved significantly since start of care.  The patient would benefit from a continuation of skilled PT for a further progression of strengthening and functional mobility needed to perform her ADLS but also to assist her mother who has been diagnosed with cancer and had recent surgery.  Will continue to update and promote independence in a HEP needed for a return to the highest functional level possible with ADLs.         OBJECTIVE IMPAIRMENTS: cardiopulmonary status limiting activity, decreased activity tolerance, decreased balance, decreased cognition, decreased endurance, difficulty walking, decreased strength, impaired perceived functional ability, impaired UE functional use, and pain.   ACTIVITY LIMITATIONS: carrying, lifting, bending, sitting, standing, squatting, stairs, transfers, bed mobility, hygiene/grooming, and locomotion level  PARTICIPATION LIMITATIONS: meal prep, cleaning, laundry, shopping, community activity, and church  PERSONAL FACTORS: Time since onset of injury/illness/exacerbation and 1-2 comorbidities: Diabetes, HTN are also affecting patient's functional outcome.   REHAB POTENTIAL: Good  CLINICAL DECISION MAKING: Evolving/moderate complexity  EVALUATION COMPLEXITY: Moderate  PLAN:  PT FREQUENCY:  1x/week  PT DURATION: 8 weeks  PLANNED INTERVENTIONS: 97164- PT Re-evaluation, 97110-Therapeutic exercises, 97530- Therapeutic activity, 97112- Neuromuscular re-education, 97535- Self Care, 02859- Manual therapy, [phone]- Aquatic Therapy, Patient/Family education, Balance training, Cryotherapy, and Moist heat  PLAN FOR NEXT SESSION: submit for additional visit request;  Assess response to treatment session;  strengthening, flexibility, activity tolerance  Glade Pesa, PT 01/12/24 5:02 PM Phone: [phone] Fax: [phone]  [facility] [address] [place], [place] [address] Phone # [phone] Fax [phone]

## 2024-01-19 ENCOUNTER — Ambulatory Visit: Admitting: Physical Therapy

## 2024-01-26 ENCOUNTER — Ambulatory Visit: Attending: Family Medicine | Admitting: Physical Therapy

## 2024-01-26 DIAGNOSIS — R262 Difficulty in walking, not elsewhere classified: Secondary | ICD-10-CM | POA: Insufficient documentation

## 2024-01-26 DIAGNOSIS — M6281 Muscle weakness (generalized): Secondary | ICD-10-CM | POA: Diagnosis present

## 2024-01-26 NOTE — Therapy (Signed)
 OUTPATIENT PHYSICAL THERAPY TREATMENT NOTE   Patient Name: Christy Torres MRN: [id number] DOB:04/22/1971, 53 y.o., female Today's Date: 01/26/2024   PCP: Kip Ade NP REFERRING PROVIDER: Dario Daniels FNP  END OF SESSION:  PT End of Session - 01/26/24 1536     Visit Number 12    Date for PT Re-Evaluation 03/08/24    Authorization Type [place] Medicaid Healthy [facility] 8/7-9/5    Authorization - Visit Number 1    Authorization - Number of Visits 4    PT Start Time 1533    PT Stop Time 1612    PT Time Calculation (min) 39 min    Activity Tolerance Patient tolerated treatment well               Past Medical History:  Diagnosis Date   Asthma    Diabetes mellitus without complication (HCC)    Hypercholesteremia    Thyroid disease    Past Surgical History:  Procedure Laterality Date   PILONIDAL CYST DRAINAGE  2010   Patient Active Problem List   Diagnosis Date Noted   Type 2 diabetes mellitus with hyperglycemia, without long-term current use of insulin (HCC) 12/21/2021   Chronic fatigue 12/21/2021   Colon cancer screening 12/21/2021   Thyroid disease 12/21/2021   Family history of thyroid disease in mother 12/21/2021    ONSET DATE: 3 years  REFERRING DIAG: U09.9 Long Covid; Chronic fatigue R53.82, decreased activity tolerance R68.89  THERAPY DIAG:  Weakness; difficulty in walking Rationale for Evaluation and Treatment: Rehabilitation  SUBJECTIVE:                                                                                                                                                                                             SUBJECTIVE STATEMENT: I'm planning on doing more at home now that appts have settled down (mom does not need chemo but still recovering from colon CA surgery); states she did fine after last PT session.    Had FCE on 01/07/24 for disability.  PERTINENT HISTORY:  Diabetes mellitus; HTN; PAD; Covid 3 years ago  PAIN:    Are you having pain? Yes NPRS scale: 4/10 Pain location: general all over body pain: arms and torso Pain orientation: Right and Left  PAIN TYPE: soreness Pain description: constant  Aggravating factors: activity  Relieving factors: lying down, resting    PRECAUTIONS: None     WEIGHT BEARING RESTRICTIONS: No  FALLS: Has patient fallen in last 6 months? 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   COGNITION: Overall cognitive status: Within functional limits for tasks assessed    POSTURE: No Significant postural limitations  LOWER EXTREMITY ROM:   WFLs  STRENGTH:  grossly 4/5 in Ues and Les;  Decreased activation of transverse abdominus muscles; abdominals 4-/5; decreased activation of lumbar multifidi; trunk extensors 4-/5   GAIT: decreased gait speed; no assistive device used   FUNCTIONAL TESTS:  Eval: Able to rise from the chair on 2nd attempt 1x without UE assist but very difficult   11/04/2023: 5 times sit to/from stand:  33.97 sec   Timed Up and Go (TUG):  12.60 sec 3 minute walk test:  497 ft with RPE of 4/10  11/28/23: 5 min walk test: 868 ft RPE 4-5/10  12/05/23  5XSTS 12/05/23  18.63 sec  7/31: 5 min walk test 945 feet RPE 4/10          5x STS:  19.01  PATIENT SURVEYS:  LEFS 41/80 eval LEFS  42/80     12/05/23 7/31: 42/80    The Patient-Specific Functional Scale  Initial:  I am going to ask you to identify up to 3 important activities that you are unable to do or are having difficulty with as a result of this problem.  Today are there any activities that you are unable to do or having difficulty with because of this?  (Patient  shown scale and patient rated each activity)  Follow up: When you first came in you had difficulty performing these activities.  Today do you still have difficulty?  Patient-Specific activity scoring scheme (Point to one number):  0 1 2 3 4 5 6 7 8 9  10 Unable                                                                                                          Able to perform To perform                                                                                                    activity at the same Activity         Level as before  Injury or problem  Activity        Be able to read                                                                         Initial:          2             7/31: 4  2.       Take my dog for walk                                                                           Initial:          3          7/31: 4     3.         Energy to go out to go to a coffee shop or a movie or church            Initial:           2       7/31: 2                                                                                                                                                                              TREATMENT DATE:  01/26/24: Nu step (blue machine) level 2 x 7 min while discussing status able to complete full lap + 1/4 lap Warm up: Seated 5# core series:   hip to hip, hip to shoulder   x 10 each 2 rounds: 2 min rest between rounds Standing heel raise  x 10 holding 5# KB Standing marching with arm swings x10 holding 5# KB Standing 5# kettlebell pass around the waist 10x Standing bicep curls 5# 10x Back step partial lunge 10x each side holding 5# KB 2 rounds: 2 min rest between rounds Sit to stand holding 5# weight 5x Standing overhead press 5# 5x each side  Standing hip circles 5x right/left Counter push ups 10x RPE 5/10     01/12/2024: Nu step ( green machine) level 5 x 6 min done at end of session today (76  SPM) - able to complete full lap LEFS 5 min walk test PSFS Standing  5# core series:    hip to shoulder, Vs, ear to ear  x 10 each Seated 5#  overhead press 5x Standing 5# kettlebell pass around the waist 10x Standing bicep curls 5# 10x Standing marching with arm swings x10   01/04/2024: Seated 5# core series:   hip to hip, hip to shoulder, Vs, ear to ear  x 10 each 2 rounds: 2 min rest between rounds Sit to stand holding 5# weight 3x Standing hip abduction 5x  Standing hip extension 5x Seated hip flexion up and over 4 inch high target 5# resting on thigh over hurdle Seated 5#  overhead press 5x 2 rounds: 2 min rest between rounds Standing heel raise  x 10  Standing marching with arm swings x10, Standing 5# kettlebell pass around the waist 10x Standing bicep curls 5# 10x Back step partial lunge 10x each side holding to // bars RPE 6/10 Nu step ( green machine) level 5 x 6 min done at end of session today (76 SPM) - able to complete full lap   PATIENT EDUCATION: Education details: Educated patient on anatomy and physiology of current symptoms, prognosis, plan of care as well as initial self care strategies to promote recovery Person educated: Patient Education method: Explanation Education comprehension: verbalized understanding  HOME EXERCISE PROGRAM: Access Code: [email or web address] URL: [email or web address] Date: 11/04/2023 Prepared by: Jarrell Menke  Exercises - Supine Diaphragmatic Breathing  - 1 x daily - 7 x weekly - 1 sets - 7 reps - Prone Diaphragmatic Breathing  - 1 x daily - 7 x weekly - 1 sets - 7 reps - Seated Diaphragmatic Breathing  - 1 x daily - 7 x weekly - 1 sets - 10 reps - Hooklying Isometric Hip Flexion with Opposite Arm  - 1 x daily - 7 x weekly - 1 sets - 5 reps - Seated Eye and Head Movement Coordination - Side to Side  - 1 x daily - 7 x weekly - 1 sets - 5  reps - Seated Long Arc Quad  - 1 x daily - 7 x weekly - 2 sets - 10 reps - Seated March  - 1 x daily - 7 x weekly - 2 sets - 10 reps - Sit to Stand with Armchair  - 1 x daily - 7 x weekly - 2 sets - 5 reps - Seated Hamstring Stretch  - 1 x daily - 7 x weekly - 2 reps - 20 sec hold  GOALS: Goals reviewed with patient? Yes  SHORT TERM GOALS: Target date: 12/14/2023   The patient will demonstrate knowledge of basic self care strategies and exercises to promote healing  Baseline: Goal status: MET  2.  The patient will be able to walk for 5 minutes without stopping Baseline:  Goal status: MET 11/28/23  3.  Improved LE strength with patient able to rise from a standard height chair without UE assist with ease Baseline:  Goal status: MET 6/16/      LONG TERM GOALS: Target date: 03/08/2024    The patient will be independent in a safe self progression of a home exercise program to promote further recovery of function   Baseline:  Goal status: IN PROGRESS  2.  PSFS score for reading improved to 5 Baseline:  Goal status:  IN PROGRESS  3.  Patient will be able to  walk her dog PSFS improved to 5 Baseline:  Goal  status: IN PROGRESS  4.  Patient will be able to go on an hour long outing for coffee or attend church with PSFS score of 5 Baseline:  Goal status: IN PROGRESS  5.  Lower Extremity Functional Scale improved to   50/80   indicating improved function  Baseline:  Goal status: IN PROGRESS  ASSESSMENT:  CLINICAL IMPRESSION: Hansini has been able to gradually progress exercise program with close monitoring to avoid overexertion and exacerbation of symptoms.  She has done well the past few sessions with circuit style rounds of ex's with built in rest breaks of 1-2 minutes to allow for recovery. Her rating of perceived exertion at an acceptable level of 5-6.  She was able to perform ex's at a slightly higher intensity.        OBJECTIVE IMPAIRMENTS: cardiopulmonary status  limiting activity, decreased activity tolerance, decreased balance, decreased cognition, decreased endurance, difficulty walking, decreased strength, impaired perceived functional ability, impaired UE functional use, and pain.   ACTIVITY LIMITATIONS: carrying, lifting, bending, sitting, standing, squatting, stairs, transfers, bed mobility, hygiene/grooming, and locomotion level  PARTICIPATION LIMITATIONS: meal prep, cleaning, laundry, shopping, community activity, and church  PERSONAL FACTORS: Time since onset of injury/illness/exacerbation and 1-2 comorbidities: Diabetes, HTN are also affecting patient's functional outcome.   REHAB POTENTIAL: Good  CLINICAL DECISION MAKING: Evolving/moderate complexity  EVALUATION COMPLEXITY: Moderate  PLAN:  PT FREQUENCY: 1x/week  PT DURATION: 8 weeks  PLANNED INTERVENTIONS: 97164- PT Re-evaluation, 97110-Therapeutic exercises, 97530- Therapeutic activity, 97112- Neuromuscular re-education, 97535- Self Care, 02859- Manual therapy, [phone]- Aquatic Therapy, Patient/Family education, Balance training, Cryotherapy, and Moist heat  PLAN FOR NEXT SESSION:   Assess response to treatment session;  strengthening, flexibility, activity tolerance; 3 more sessions and will finalize HEP  Glade Pesa, PT 01/26/24 7:35 PM Phone: [phone] Fax: [phone]  [facility] [address], [address] [place], [place] [address] Phone # [phone] Fax [phone]

## 2024-02-01 NOTE — Therapy (Deleted)
 OUTPATIENT PHYSICAL THERAPY TREATMENT NOTE   Patient Name: Christy Torres MRN: [id number] DOB:1970/08/28, 53 y.o., female Today's Date: 02/01/2024   PCP: Kip Ade NP REFERRING PROVIDER: Dario Daniels FNP  END OF SESSION:         Past Medical History:  Diagnosis Date   Asthma    Diabetes mellitus without complication (HCC)    Hypercholesteremia    Thyroid disease    Past Surgical History:  Procedure Laterality Date   PILONIDAL CYST DRAINAGE  2010   Patient Active Problem List   Diagnosis Date Noted   Type 2 diabetes mellitus with hyperglycemia, without long-term current use of insulin (HCC) 12/21/2021   Chronic fatigue 12/21/2021   Colon cancer screening 12/21/2021   Thyroid disease 12/21/2021   Family history of thyroid disease in mother 12/21/2021    ONSET DATE: 3 years  REFERRING DIAG: U09.9 Long Covid; Chronic fatigue R53.82, decreased activity tolerance R68.89  THERAPY DIAG:  Weakness; difficulty in walking Rationale for Evaluation and Treatment: Rehabilitation  SUBJECTIVE:                                                                                                                                                                                             SUBJECTIVE STATEMENT: I'm planning on doing more at home now that appts have settled down (mom does not need chemo but still recovering from colon CA surgery); states she did fine after last PT session.    Had FCE on 01/07/24 for disability.  PERTINENT HISTORY:  Diabetes mellitus; HTN; PAD; Covid 3 years ago  PAIN:   Are you having pain? Yes NPRS scale: 4/10 Pain location: general all over body pain: arms and torso Pain orientation: Right and Left  PAIN TYPE: soreness Pain description: constant  Aggravating factors: activity  Relieving factors: lying down, resting    PRECAUTIONS: None     WEIGHT BEARING RESTRICTIONS: No  FALLS: Has patient fallen in last 6 months?  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   COGNITION: Overall cognitive status: Within functional limits for tasks assessed    POSTURE: No Significant postural limitations  LOWER EXTREMITY ROM:   WFLs  STRENGTH:  grossly 4/5 in Ues and Les;  Decreased activation of transverse abdominus muscles; abdominals 4-/5; decreased activation of lumbar multifidi; trunk extensors 4-/5  GAIT: decreased gait speed; no assistive device used   FUNCTIONAL TESTS:  Eval: Able to rise from the chair on 2nd attempt 1x without UE assist but very difficult   11/04/2023: 5 times sit to/from stand:  33.97 sec   Timed Up and Go (TUG):  12.60 sec 3 minute walk test:  497 ft with RPE of 4/10  11/28/23: 5 min walk test: 868 ft RPE 4-5/10  12/05/23  5XSTS 12/05/23  18.63 sec  7/31: 5 min walk test 945 feet RPE 4/10          5x STS:  19.01  PATIENT SURVEYS:  LEFS 41/80 eval LEFS  42/80     12/05/23 7/31: 42/80    The Patient-Specific Functional Scale  Initial:  I am going to ask you to identify up to 3 important activities that you are unable to do or are having difficulty with as a result of this problem.  Today are there any activities that you are unable to do or having difficulty with because of this?  (Patient shown scale and patient rated each activity)  Follow up: When you first came in you had difficulty performing these activities.  Today do you still have difficulty?  Patient-Specific activity scoring scheme (Point to one number):  0 1 2 3 4 5 6 7 8 9  10 Unable                                                                                                           Able to perform To perform                                                                                                    activity at the same Activity         Level as before                                                                                                                       Injury or problem  Activity        Be able to read  Initial:          2             7/31: 4  2.       Take my dog for walk                                                                           Initial:          3          7/31: 4     3.         Energy to go out to go to a coffee shop or a movie or church            Initial:           2       7/31: 2                                                                                                                                                                              TREATMENT DATE:  01/26/24: Nu step (blue machine) level 2 x 7 min while discussing status able to complete full lap + 1/4 lap Warm up: Seated 5# core series:   hip to hip, hip to shoulder   x 10 each 2 rounds: 2 min rest between rounds Standing heel raise  x 10 holding 5# KB Standing marching with arm swings x10 holding 5# KB Standing 5# kettlebell pass around the waist 10x Standing bicep curls 5# 10x Back step partial lunge 10x each side holding 5# KB 2 rounds: 2 min rest between rounds Sit to stand holding 5# weight 5x Standing overhead press 5# 5x each side  Standing hip circles 5x right/left Counter push ups 10x RPE 5/10    01/12/2024: Nu step ( green machine) level 5 x 6 min done at end of session today (76 SPM) - able to complete full lap LEFS 5 min walk test PSFS Standing  5# core series:    hip to shoulder, Vs, ear to ear  x 10 each Seated 5#  overhead press 5x Standing 5# kettlebell pass around the waist 10x Standing bicep curls 5# 10x Standing marching with arm swings  x10   01/04/2024: Seated 5# core series:   hip to hip, hip to shoulder, Vs, ear to ear  x 10 each 2 rounds: 2 min rest between rounds Sit  to stand holding 5# weight 3x Standing hip abduction 5x  Standing hip extension 5x Seated hip flexion up and over 4 inch high target 5# resting on thigh over hurdle Seated 5#  overhead press 5x 2 rounds: 2 min rest between rounds Standing heel raise  x 10  Standing marching with arm swings x10, Standing 5# kettlebell pass around the waist 10x Standing bicep curls 5# 10x Back step partial lunge 10x each side holding to // bars RPE 6/10 Nu step ( green machine) level 5 x 6 min done at end of session today (76 SPM) - able to complete full lap   PATIENT EDUCATION: Education details: Educated patient on anatomy and physiology of current symptoms, prognosis, plan of care as well as initial self care strategies to promote recovery Person educated: Patient Education method: Explanation Education comprehension: verbalized understanding  HOME EXERCISE PROGRAM: Access Code: [email or web address] URL: [email or web address] Date: 11/04/2023 Prepared by: Jarrell Menke  Exercises - Supine Diaphragmatic Breathing  - 1 x daily - 7 x weekly - 1 sets - 7 reps - Prone Diaphragmatic Breathing  - 1 x daily - 7 x weekly - 1 sets - 7 reps - Seated Diaphragmatic Breathing  - 1 x daily - 7 x weekly - 1 sets - 10 reps - Hooklying Isometric Hip Flexion with Opposite Arm  - 1 x daily - 7 x weekly - 1 sets - 5 reps - Seated Eye and Head Movement Coordination - Side to Side  - 1 x daily - 7 x weekly - 1 sets - 5 reps - Seated Long Arc Quad  - 1 x daily - 7 x weekly - 2 sets - 10 reps - Seated March  - 1 x daily - 7 x weekly - 2 sets - 10 reps - Sit to Stand with Armchair  - 1 x daily - 7 x weekly - 2 sets - 5 reps - Seated Hamstring Stretch  - 1 x daily - 7 x weekly - 2 reps - 20 sec hold  GOALS: Goals reviewed with patient? Yes  SHORT TERM GOALS: Target date:  12/14/2023   The patient will demonstrate knowledge of basic self care strategies and exercises to promote healing  Baseline: Goal status: MET  2.  The patient will be able to walk for 5 minutes without stopping Baseline:  Goal status: MET 11/28/23  3.  Improved LE strength with patient able to rise from a standard height chair without UE assist with ease Baseline:  Goal status: MET 6/16/      LONG TERM GOALS: Target date: 03/08/2024    The patient will be independent in a safe self progression of a home exercise program to promote further recovery of function   Baseline:  Goal status: IN PROGRESS  2.  PSFS score for reading improved to 5 Baseline:  Goal status:  IN PROGRESS  3.  Patient will be able to  walk her dog PSFS improved to 5 Baseline:  Goal status: IN PROGRESS  4.  Patient will be able to go on an hour long outing for coffee or attend church with PSFS score of 5 Baseline:  Goal status: IN PROGRESS  5.  Lower Extremity Functional Scale improved to   50/80   indicating improved function  Baseline:  Goal status: IN PROGRESS  ASSESSMENT:  CLINICAL IMPRESSION: Christy Torres has been able to gradually progress exercise program with close monitoring to avoid overexertion and exacerbation of symptoms.  She has done well the past  few sessions with circuit style rounds of ex's with built in rest breaks of 1-2 minutes to allow for recovery. Her rating of perceived exertion at an acceptable level of 5-6.  She was able to perform ex's at a slightly higher intensity.        OBJECTIVE IMPAIRMENTS: cardiopulmonary status limiting activity, decreased activity tolerance, decreased balance, decreased cognition, decreased endurance, difficulty walking, decreased strength, impaired perceived functional ability, impaired UE functional use, and pain.   ACTIVITY LIMITATIONS: carrying, lifting, bending, sitting, standing, squatting, stairs, transfers, bed mobility, hygiene/grooming, and  locomotion level  PARTICIPATION LIMITATIONS: meal prep, cleaning, laundry, shopping, community activity, and church  PERSONAL FACTORS: Time since onset of injury/illness/exacerbation and 1-2 comorbidities: Diabetes, HTN are also affecting patient's functional outcome.   REHAB POTENTIAL: Good  CLINICAL DECISION MAKING: Evolving/moderate complexity  EVALUATION COMPLEXITY: Moderate  PLAN:  PT FREQUENCY: 1x/week  PT DURATION: 8 weeks  PLANNED INTERVENTIONS: 97164- PT Re-evaluation, 97110-Therapeutic exercises, 97530- Therapeutic activity, 97112- Neuromuscular re-education, 97535- Self Care, 02859- Manual therapy, [phone]- Aquatic Therapy, Patient/Family education, Balance training, Cryotherapy, and Moist heat  PLAN FOR NEXT SESSION:   Assess response to treatment session;  strengthening, flexibility, activity tolerance; 3 more sessions and will finalize HEP  Glade Pesa, PT 02/01/24 7:52 PM Phone: [phone] Fax: [phone]  [facility] [address] [place], [place] [address] Phone # [phone] Fax [phone]

## 2024-02-02 ENCOUNTER — Ambulatory Visit: Admitting: Physical Therapy

## 2024-02-07 ENCOUNTER — Ambulatory Visit: Admitting: Physical Therapy

## 2024-02-07 ENCOUNTER — Encounter: Payer: Self-pay | Admitting: Physical Therapy

## 2024-02-07 DIAGNOSIS — M6281 Muscle weakness (generalized): Secondary | ICD-10-CM | POA: Diagnosis not present

## 2024-02-07 DIAGNOSIS — R262 Difficulty in walking, not elsewhere classified: Secondary | ICD-10-CM

## 2024-02-07 NOTE — Therapy (Signed)
 OUTPATIENT PHYSICAL THERAPY TREATMENT NOTE   Patient Name: Christy Torres MRN: [id number] DOB:1970/10/08, 53 y.o., female Today's Date: 02/07/2024   PCP: Kip Ade NP REFERRING PROVIDER: Dario Daniels FNP  END OF SESSION:  PT End of Session - 02/07/24 1432     Visit Number 13    Date for PT Re-Evaluation 03/08/24    Authorization Type [place] Medicaid [facility] 8/7-9/5    Authorization Time Period [employer] Approved 4 visits-12/19/2023-01/17/2024-auth#[id number]    Authorization - Visit Number 2    Authorization - Number of Visits 4    PT Start Time 1400    PT Stop Time 1430    PT Time Calculation (min) 30 min    Activity Tolerance Patient tolerated treatment well    Behavior During Therapy WFL for tasks assessed/performed                Past Medical History:  Diagnosis Date   Asthma    Diabetes mellitus without complication (HCC)    Hypercholesteremia    Thyroid disease    Past Surgical History:  Procedure Laterality Date   PILONIDAL CYST DRAINAGE  2010   Patient Active Problem List   Diagnosis Date Noted   Type 2 diabetes mellitus with hyperglycemia, without long-term current use of insulin (HCC) 12/21/2021   Chronic fatigue 12/21/2021   Colon cancer screening 12/21/2021   Thyroid disease 12/21/2021   Family history of thyroid disease in mother 12/21/2021    ONSET DATE: 3 years  REFERRING DIAG: U09.9 Long Covid; Chronic fatigue R53.82, decreased activity tolerance R68.89  THERAPY DIAG:  Weakness; difficulty in walking Rationale for Evaluation and Treatment: Rehabilitation  SUBJECTIVE:                                                                                                                                                                                             SUBJECTIVE STATEMENT: I missed last week because I think everything finally caught up me. I just stayed in bed. I'm feeling better. Saturday I did some things mowed the grass  and did the dishes. Then Sunday rested. I've definitely gone backwards a bit. My background pain is up to 4-5 and the fatigue is pretty bad. Background pain was 1-2/10 prior to last week.    Had FCE on 01/07/24 for disability.  PERTINENT HISTORY:  Diabetes mellitus; HTN; PAD; Covid 3 years ago  PAIN:   Are you having pain? Yes NPRS scale: 4/10 Pain location: general all over body pain: arms and torso Pain orientation: Right and Left  PAIN TYPE: soreness Pain description: constant  Aggravating factors: activity  Relieving factors: lying  down, resting    PRECAUTIONS: None     WEIGHT BEARING RESTRICTIONS: No  FALLS: Has patient fallen in last 6 months? 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   COGNITION: Overall cognitive status: Within functional limits for tasks assessed    POSTURE: No Significant postural limitations  LOWER EXTREMITY ROM:   WFLs  STRENGTH:  grossly 4/5 in Ues and Les;  Decreased activation of transverse abdominus muscles; abdominals 4-/5; decreased activation of lumbar multifidi; trunk extensors 4-/5   GAIT: decreased gait speed; no assistive device used   FUNCTIONAL TESTS:  Eval: Able to rise from the chair on 2nd attempt 1x without UE assist but very difficult   11/04/2023: 5 times sit to/from stand:  33.97 sec   Timed Up and Go (TUG):  12.60 sec 3 minute walk test:  497 ft with RPE of 4/10  11/28/23: 5 min walk test: 868 ft RPE 4-5/10  12/05/23  5XSTS 12/05/23  18.63 sec  7/31: 5 min walk test 945 feet RPE 4/10          5x STS:  19.01  PATIENT SURVEYS:  LEFS 41/80 eval LEFS  42/80     12/05/23 7/31: 42/80     The Patient-Specific Functional Scale  Initial:  I am going to ask you to identify up to 3 important activities that you are unable to do or are having difficulty with as a result of this problem.  Today are there any activities that you are unable to do or having difficulty with because of this?  (Patient shown scale and patient rated each activity)  Follow up: When you first came in you had difficulty performing these activities.  Today do you still have difficulty?  Patient-Specific activity scoring scheme (Point to one number):  0 1 2 3 4 5 6 7 8 9  10 Unable                                                                                                          Able to perform To perform                                                                                                    activity at the same Activity         Level as before  Injury or problem  Activity        Be able to read                                                                         Initial:          2             7/31: 4  8/26 5  2.       Take my dog for walk                                                                           Initial:          3          7/31: 4    8/26 4  3.         Energy to go out to go to a coffee shop or a movie or church            Initial:           2       7/31: 2         8/26 2                                                                                                                                                                     TREATMENT DATE:  02/07/24: Nu step (blue machine) level 2 x 7 min while discussing status able to complete full lap + 1/4 lap Warm up: Seated 5# core series:   hip to hip, hip to shoulder   x 10 each 2 rounds: 2 min rest between rounds Standing heel raise  x 10 holding 5# KB Standing marching with arm swings x10  holding 5# KB Standing 5# kettlebell pass around the waist 10x Standing bicep curls 5# 10x ea Back step partial lunge 10x each side holding 5# KB 2 rounds: 2 min rest between rounds Sit to stand holding 5# weight 10x on first round, 5x on 2nd round Standing overhead press 5# 5x each side  Standing hip circles 5x right/left Counter push ups 10x RPE 6/10   01/26/24: Nu step (blue machine)  level 2 x 7 min while discussing status able to complete full lap + 1/4 lap Warm up: Seated 5# core series:   hip to hip, hip to shoulder   x 10 each 2 rounds: 2 min rest between rounds Standing heel raise  x 10 holding 5# KB Standing marching with arm swings x10 holding 5# KB Standing 5# kettlebell pass around the waist 10x Standing bicep curls 5# 10x Back step partial lunge 10x each side holding 5# KB 2 rounds: 2 min rest between rounds Sit to stand holding 5# weight 5x Standing overhead press 5# 5x each side  Standing hip circles 5x right/left Counter push ups 10x RPE 5/10    01/12/2024: Nu step ( green machine) level 5 x 6 min done at end of session today (76 SPM) - able to complete full lap LEFS 5 min walk test PSFS Standing  5# core series:    hip to shoulder, Vs, ear to ear  x 10 each Seated 5#  overhead press 5x Standing 5# kettlebell pass around the waist 10x Standing bicep curls 5# 10x Standing marching with arm swings x10   01/04/2024: Seated 5# core series:   hip to hip, hip to shoulder, Vs, ear to ear  x 10 each 2 rounds: 2 min rest between rounds Sit to stand holding 5# weight 3x Standing hip abduction 5x  Standing hip extension 5x Seated hip flexion up and over 4 inch high target 5# resting on thigh over hurdle Seated 5#  overhead press 5x 2 rounds: 2 min rest between rounds Standing heel raise  x 10  Standing marching with arm swings x10, Standing 5# kettlebell pass around the waist 10x Standing bicep curls 5# 10x Back step partial lunge 10x each side holding to //  bars RPE 6/10 Nu step ( green machine) level 5 x 6 min done at end of session today (76 SPM) - able to complete full lap   PATIENT EDUCATION: Education details: Educated patient on anatomy and physiology of current symptoms, prognosis, plan of care as well as initial self care strategies to promote recovery Person educated: Patient Education method: Explanation Education comprehension: verbalized understanding  HOME EXERCISE PROGRAM: Access Code: [email or web address] URL: [email or web address] Date: 11/04/2023 Prepared by: Jarrell Menke  Exercises - Supine Diaphragmatic Breathing  - 1 x daily - 7 x weekly - 1 sets - 7 reps - Prone Diaphragmatic Breathing  - 1 x daily - 7 x weekly - 1 sets - 7 reps - Seated Diaphragmatic Breathing  - 1 x daily - 7 x weekly - 1 sets - 10 reps - Hooklying Isometric Hip Flexion with Opposite Arm  - 1 x daily - 7 x weekly - 1 sets - 5 reps - Seated Eye and Head Movement Coordination - Side to Side  - 1 x daily - 7 x weekly - 1 sets - 5 reps - Seated Long Arc Quad  - 1 x daily - 7 x weekly - 2 sets - 10 reps - Seated March  - 1 x daily - 7 x weekly - 2 sets - 10 reps - Sit to Stand with Armchair  - 1 x daily - 7 x weekly - 2 sets - 5 reps - Seated Hamstring Stretch  - 1 x daily - 7 x weekly - 2 reps - 20 sec hold  GOALS: Goals reviewed with patient? Yes  SHORT TERM GOALS: Target date: 12/14/2023   The patient will demonstrate knowledge of basic self care strategies  and exercises to promote healing  Baseline: Goal status: MET  2.  The patient will be able to walk for 5 minutes without stopping Baseline:  Goal status: MET 11/28/23  3.  Improved LE strength with patient able to rise from a standard height chair without UE assist with ease Baseline:  Goal status: MET 6/16/      LONG TERM GOALS: Target date: 03/08/2024    The patient will be independent in a safe self progression of a home exercise program to promote further recovery of  function   Baseline:  Goal status: IN PROGRESS  2.  PSFS score for reading improved to 5 Baseline:  Goal status: MET  02/07/24  3.  Patient will be able to  walk her dog PSFS improved to 5 Baseline:  Goal status: IN PROGRESS  4.  Patient will be able to go on an hour long outing for coffee or attend church with PSFS score of 5 Baseline:  Goal status: IN PROGRESS  5.  Lower Extremity Functional Scale improved to   50/80   indicating improved function  Baseline:  Goal status: IN PROGRESS  ASSESSMENT:  CLINICAL IMPRESSION: Patient reports that she had to miss last week due to exhaustion. She feels everything finally caught up to her and she spend the week in bed. She is starting to feel better and did some chores on Saturday, but took it easy Sunday. She was able to tolerate 30 min today with RPE of 6/10, but did not want to continue beyond 30 min.  Reading is still difficult in regards to books. She is a little better with articles and she has met her PSFS goal for reading. She has not walked her dog at all, but plans to now that the weather is cooler.       OBJECTIVE IMPAIRMENTS: cardiopulmonary status limiting activity, decreased activity tolerance, decreased balance, decreased cognition, decreased endurance, difficulty walking, decreased strength, impaired perceived functional ability, impaired UE functional use, and pain.   ACTIVITY LIMITATIONS: carrying, lifting, bending, sitting, standing, squatting, stairs, transfers, bed mobility, hygiene/grooming, and locomotion level  PARTICIPATION LIMITATIONS: meal prep, cleaning, laundry, shopping, community activity, and church  PERSONAL FACTORS: Time since onset of injury/illness/exacerbation and 1-2 comorbidities: Diabetes, HTN are also affecting patient's functional outcome.   REHAB POTENTIAL: Good  CLINICAL DECISION MAKING: Evolving/moderate complexity  EVALUATION COMPLEXITY: Moderate  PLAN:  PT FREQUENCY: 1x/week  PT  DURATION: 8 weeks  PLANNED INTERVENTIONS: 97164- PT Re-evaluation, 97110-Therapeutic exercises, 97530- Therapeutic activity, 97112- Neuromuscular re-education, 97535- Self Care, 02859- Manual therapy, [phone]- Aquatic Therapy, Patient/Family education, Balance training, Cryotherapy, and Moist heat  PLAN FOR NEXT SESSION:   Assess response to treatment session;  strengthening, flexibility, activity tolerance; 3 more sessions and will finalize HEP  Mliss Cummins, PT  02/07/24 2:37 PM Phone: [phone] Fax: [phone]  [facility] [address] [place], [place] [address] Phone # [phone] Fax [phone]

## 2024-02-16 ENCOUNTER — Ambulatory Visit: Attending: Family Medicine | Admitting: Physical Therapy

## 2024-02-16 DIAGNOSIS — M6281 Muscle weakness (generalized): Secondary | ICD-10-CM | POA: Diagnosis present

## 2024-02-16 DIAGNOSIS — R262 Difficulty in walking, not elsewhere classified: Secondary | ICD-10-CM | POA: Diagnosis present

## 2024-02-16 NOTE — Therapy (Signed)
 OUTPATIENT PHYSICAL THERAPY TREATMENT NOTE/DISCHARGE SUMMARY    Patient Name: Christy Torres MRN: [id number] DOB:12/01/1970, 53 y.o., female Today's Date: 02/16/2024   PCP: Kip Ade NP REFERRING PROVIDER: Dario Daniels FNP  END OF SESSION:  PT End of Session - 02/16/24 1444     Visit Number 14    Date for PT Re-Evaluation 03/08/24    Authorization Type [employer] [facility] 8/7-9/5    Authorization Time Period [employer] Approved 4 visits   Authorization - Visit Number 3    Authorization - Number of Visits 4    PT Start Time 1445    PT Stop Time 1525    PT Time Calculation (min) 40 min    Activity Tolerance Patient tolerated treatment well                Past Medical History:  Diagnosis Date   Asthma    Diabetes mellitus without complication (HCC)    Hypercholesteremia    Thyroid disease    Past Surgical History:  Procedure Laterality Date   PILONIDAL CYST DRAINAGE  2010   Patient Active Problem List   Diagnosis Date Noted   Type 2 diabetes mellitus with hyperglycemia, without long-term current use of insulin (HCC) 12/21/2021   Chronic fatigue 12/21/2021   Colon cancer screening 12/21/2021   Thyroid disease 12/21/2021   Family history of thyroid disease in mother 12/21/2021    ONSET DATE: 3 years  REFERRING DIAG: U09.9 Long Covid; Chronic fatigue R53.82, decreased activity tolerance R68.89  THERAPY DIAG:  Weakness; difficulty in walking Rationale for Evaluation and Treatment: Rehabilitation  SUBJECTIVE:                                                                                                                                                                                             SUBJECTIVE STATEMENT: I helped clean out the shed on Tuesday.  I climbed into bed afterwards.  30% better overall since start of care.    Had FCE on 01/07/24 for disability.  PERTINENT HISTORY:  Diabetes mellitus; HTN; PAD; Covid 3 years  ago  PAIN:   Are you having pain? Yes NPRS scale: 4/10 Pain location: general all over body pain: arms and torso Pain orientation: Right and Left  PAIN TYPE: soreness Pain description: constant  Aggravating factors: activity  Relieving factors: lying down, resting    PRECAUTIONS: None     WEIGHT BEARING RESTRICTIONS: No  FALLS: Has patient fallen in last 6 months? 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   COGNITION: Overall cognitive status: Within functional limits for tasks assessed    POSTURE: No Significant postural limitations  LOWER EXTREMITY ROM:   WFLs  STRENGTH:  grossly 4/5 in Ues and Les;  Decreased activation of transverse abdominus muscles; abdominals 4-/5; decreased activation of lumbar multifidi; trunk extensors 4-/5   GAIT: decreased gait speed; no assistive device used   FUNCTIONAL TESTS:  Eval: Able to rise from the chair on 2nd attempt 1x without UE assist but very difficult   11/04/2023: 5 times sit to/from stand:  33.97 sec   Timed Up and Go (TUG):  12.60 sec 3 minute walk test:  497 ft with RPE of 4/10  11/28/23: 5 min walk test: 868 ft RPE 4-5/10  12/05/23  5XSTS 12/05/23  18.63 sec  7/31: 5 min walk test 945 feet RPE 4/10          5x STS:  19.01  PATIENT SURVEYS:  LEFS 41/80 eval LEFS  42/80     12/05/23 7/31: 42/80  9/4:  57/80   The Patient-Specific Functional Scale  Initial:  I am going to ask you to identify up to 3 important activities that you are unable to do or are having difficulty with as a result of this problem.  Today are there any activities that you are unable to do or having difficulty with  because of this?  (Patient shown scale and patient rated each activity)  Follow up: When you first came in you had difficulty performing these activities.  Today do you still have difficulty?  Patient-Specific activity scoring scheme (Point to one number):  0 1 2 3 4 5 6 7 8 9  10 Unable                                                                                                          Able to perform To perform                                                                                                    activity at the same Activity         Level as before  Injury or problem  Activity        Be able to read                                                                         Initial:          2             7/31: 4  8/26 5  2.       Take my dog for walk                                                                           Initial:          3          7/31: 4    8/26 4  3.         Energy to go out to go to a coffee shop or a movie or church            Initial:           2       7/31: 2         8/26 2                                                                                                                                                                     TREATMENT DATE:  02/16/24: Nu step (blue machine) level 2 x 8 min while discussing status able to complete full lap + 1/4 lap LEFS Review of progress toward goals  1 rounds: 2 min rest between rounds Sit to stand holding 5# weight 10x on first round, 5x on 2nd round Standing overhead press 5# 5x each side  Standing hip circles 5x right/left  push ups on railings10x 2 rounds: 2 min rest between rounds Standing heel raise  x 10 holding 5# KB Standing marching with arm swings x10 holding 5# KB Standing 5# kettlebell pass around the waist 10x Standing bicep curls 5# 10x ea Back step partial lunge  10x each side holding 5# KB  02/07/24: Nu step (blue machine) level 2 x 7 min while discussing status able to complete full lap +  1/4 lap Warm up: Seated 5# core series:   hip to hip, hip to shoulder   x 10 each 2 rounds: 2 min rest between rounds Standing heel raise  x 10 holding 5# KB Standing marching with arm swings x10 holding 5# KB Standing 5# kettlebell pass around the waist 10x Standing bicep curls 5# 10x ea Back step partial lunge 10x each side holding 5# KB 2 rounds: 2 min rest between rounds Sit to stand holding 5# weight 10x on first round, 5x on 2nd round Standing overhead press 5# 5x each side  Standing hip circles 5x right/left Counter push ups 10x RPE 6/10   01/26/24: Nu step (blue machine) level 2 x 7 min while discussing status able to complete full lap + 1/4 lap Warm up: Seated 5# core series:   hip to hip, hip to shoulder   x 10 each 2 rounds: 2 min rest between rounds Standing heel raise  x 10 holding 5# KB Standing marching with arm swings x10 holding 5# KB Standing 5# kettlebell pass around the waist 10x Standing bicep curls 5# 10x Back step partial lunge 10x each side holding 5# KB 2 rounds: 2 min rest between rounds Sit to stand holding 5# weight 5x Standing overhead press 5# 5x each side  Standing hip circles 5x right/left Counter push ups 10x RPE 5/10    PATIENT EDUCATION: Education details: Educated patient on anatomy and physiology of current symptoms, prognosis, plan of care as well as initial self care strategies to promote recovery Person educated: Patient Education method: Explanation Education comprehension: verbalized understanding  HOME EXERCISE PROGRAM: Access Code: [email or web address] URL: [email or web address] Date: 11/04/2023 Prepared by: Jarrell Menke  Exercises - Supine Diaphragmatic Breathing  - 1 x daily - 7 x weekly - 1 sets - 7 reps - Prone Diaphragmatic Breathing  - 1 x daily - 7 x weekly - 1 sets - 7 reps - Seated  Diaphragmatic Breathing  - 1 x daily - 7 x weekly - 1 sets - 10 reps - Hooklying Isometric Hip Flexion with Opposite Arm  - 1 x daily - 7 x weekly - 1 sets - 5 reps - Seated Eye and Head Movement Coordination - Side to Side  - 1 x daily - 7 x weekly - 1 sets - 5 reps - Seated Long Arc Quad  - 1 x daily - 7 x weekly - 2 sets - 10 reps - Seated March  - 1 x daily - 7 x weekly - 2 sets - 10 reps - Sit to Stand with Armchair  - 1 x daily - 7 x weekly - 2 sets - 5 reps - Seated Hamstring Stretch  - 1 x daily - 7 x weekly - 2 reps - 20 sec hold  GOALS: Goals reviewed with patient? Yes  SHORT TERM GOALS: Target date: 12/14/2023   The patient will demonstrate knowledge of basic self care strategies and exercises to promote healing  Baseline: Goal status: MET  2.  The patient will be able to walk for 5 minutes without stopping Baseline:  Goal status: MET 11/28/23  3.  Improved LE strength with patient able to rise from a standard height chair without UE assist with ease Baseline:  Goal status: MET 6/16/      LONG TERM GOALS: Target date: 03/08/2024    The patient will be independent in a safe self progression of a home exercise program to promote further recovery of function   Baseline:  Goal  status: met 9/4  2.  PSFS score for reading improved to 5 Baseline:  Goal status: MET  02/07/24  3.  Patient will be able to  walk her dog PSFS improved to 5 Baseline:  Goal status: going some in the yard with dogs  PSFS 5 partially met  4.  Patient will be able to go on an hour long outing for coffee or attend church with PSFS score of 5 Baseline:  Goal status: partially met 3-4/10  5.  Lower Extremity Functional Scale improved to   50/80   indicating improved function  Baseline:  Goal status: met  ASSESSMENT:  CLINICAL IMPRESSION: The patient has met the majority of rehab goals, with noted improvements in pain reduction, outcome score, ROM, strength and functional mobility.  A  comprehensive HEP has been established and anticipate further improvements over time with regular performance of the program.  Recommend discharge from PT at this time.      OBJECTIVE IMPAIRMENTS: cardiopulmonary status limiting activity, decreased activity tolerance, decreased balance, decreased cognition, decreased endurance, difficulty walking, decreased strength, impaired perceived functional ability, impaired UE functional use, and pain.   ACTIVITY LIMITATIONS: carrying, lifting, bending, sitting, standing, squatting, stairs, transfers, bed mobility, hygiene/grooming, and locomotion level  PARTICIPATION LIMITATIONS: meal prep, cleaning, laundry, shopping, community activity, and church  PERSONAL FACTORS: Time since onset of injury/illness/exacerbation and 1-2 comorbidities: Diabetes, HTN are also affecting patient's functional outcome.   REHAB POTENTIAL: Good  CLINICAL DECISION MAKING: Evolving/moderate complexity  EVALUATION COMPLEXITY: Moderate  PLAN: PHYSICAL THERAPY DISCHARGE SUMMARY  Visits from Start of Care: 14  Current functional level related to goals / functional outcomes: See clinical impressions above   Remaining deficits: As above   Education / Equipment: HEP   Patient agrees to discharge. Patient goals were met. Patient is being discharged due to meeting the stated rehab goals.  Glade Pesa, PT 02/16/24 2:59 PM Phone: [phone] Fax: [phone]  [facility] [address], [address] [place], [place] [address] Phone # [phone] Fax [phone]

## 2024-02-22 ENCOUNTER — Encounter: Admitting: Physical Therapy

## 2024-05-07 ENCOUNTER — Other Ambulatory Visit (HOSPITAL_BASED_OUTPATIENT_CLINIC_OR_DEPARTMENT_OTHER): Payer: Self-pay | Admitting: Family Medicine

## 2024-05-07 ENCOUNTER — Encounter (HOSPITAL_BASED_OUTPATIENT_CLINIC_OR_DEPARTMENT_OTHER): Payer: Self-pay | Admitting: Radiology

## 2024-05-07 ENCOUNTER — Ambulatory Visit (HOSPITAL_BASED_OUTPATIENT_CLINIC_OR_DEPARTMENT_OTHER)
Admission: RE | Admit: 2024-05-07 | Discharge: 2024-05-07 | Disposition: A | Discharge status: Home / Self Care | Source: Ambulatory Visit

## 2024-05-07 DIAGNOSIS — Z1231 Encounter for screening mammogram for malignant neoplasm of breast: Secondary | ICD-10-CM
